# Patient Record
Sex: Male | Born: 1960 | Race: White | Hispanic: No | Marital: Single | Smoking: Former smoker
Health system: Southern US, Community
[De-identification: ages and names within clinical notes are randomized; demographics above are authoritative.]

## PROBLEM LIST (undated history)

## (undated) DIAGNOSIS — G4700 Insomnia, unspecified: Secondary | ICD-10-CM

## (undated) DIAGNOSIS — M25469 Effusion, unspecified knee: Secondary | ICD-10-CM

## (undated) DIAGNOSIS — I679 Cerebrovascular disease, unspecified: Secondary | ICD-10-CM

## (undated) DIAGNOSIS — R1013 Epigastric pain: Secondary | ICD-10-CM

## (undated) DIAGNOSIS — E785 Hyperlipidemia, unspecified: Secondary | ICD-10-CM

## (undated) DIAGNOSIS — R202 Paresthesia of skin: Secondary | ICD-10-CM

## (undated) DIAGNOSIS — R5383 Other fatigue: Secondary | ICD-10-CM

## (undated) DIAGNOSIS — H6980 Other specified disorders of Eustachian tube, unspecified ear: Secondary | ICD-10-CM

## (undated) DIAGNOSIS — L509 Urticaria, unspecified: Secondary | ICD-10-CM

## (undated) DIAGNOSIS — H6990 Unspecified Eustachian tube disorder, unspecified ear: Secondary | ICD-10-CM

## (undated) DIAGNOSIS — J322 Chronic ethmoidal sinusitis: Secondary | ICD-10-CM

## (undated) DIAGNOSIS — B079 Viral wart, unspecified: Secondary | ICD-10-CM

## (undated) DIAGNOSIS — I6529 Occlusion and stenosis of unspecified carotid artery: Secondary | ICD-10-CM

## (undated) DIAGNOSIS — G459 Transient cerebral ischemic attack, unspecified: Secondary | ICD-10-CM

## (undated) DIAGNOSIS — F419 Anxiety disorder, unspecified: Secondary | ICD-10-CM

## (undated) DIAGNOSIS — IMO0002 Reserved for concepts with insufficient information to code with codable children: Secondary | ICD-10-CM

## (undated) DIAGNOSIS — M958 Other specified acquired deformities of musculoskeletal system: Secondary | ICD-10-CM

## (undated) DIAGNOSIS — M26609 Unspecified temporomandibular joint disorder, unspecified side: Secondary | ICD-10-CM

## (undated) DIAGNOSIS — G5690 Unspecified mononeuropathy of unspecified upper limb: Secondary | ICD-10-CM

## (undated) DIAGNOSIS — M7650 Patellar tendinitis, unspecified knee: Secondary | ICD-10-CM

## (undated) DIAGNOSIS — M7040 Prepatellar bursitis, unspecified knee: Secondary | ICD-10-CM

## (undated) DIAGNOSIS — S83209A Unspecified tear of unspecified meniscus, current injury, unspecified knee, initial encounter: Secondary | ICD-10-CM

## (undated) DIAGNOSIS — L570 Actinic keratosis: Secondary | ICD-10-CM

## (undated) HISTORY — DX: Hyperlipidemia, unspecified: E78.5

## (undated) HISTORY — DX: Viral wart, unspecified: B07.9

## (undated) HISTORY — DX: Other fatigue: R53.83

## (undated) HISTORY — PX: CAROTID ENDARTERECTOMY: SUR193

## (undated) HISTORY — DX: Reserved for concepts with insufficient information to code with codable children: IMO0002

## (undated) HISTORY — DX: Urticaria, unspecified: L50.9

## (undated) HISTORY — PX: HERNIA REPAIR: SHX51

## (undated) HISTORY — DX: Paresthesia of skin: R20.2

## (undated) HISTORY — DX: Insomnia, unspecified: G47.00

## (undated) HISTORY — DX: Unspecified tear of unspecified meniscus, current injury, unspecified knee, initial encounter: S83.209A

## (undated) HISTORY — DX: Epigastric pain: R10.13

## (undated) HISTORY — PX: APPENDECTOMY: SHX54

## (undated) HISTORY — DX: Unspecified mononeuropathy of unspecified upper limb: G56.90

## (undated) HISTORY — DX: Prepatellar bursitis, unspecified knee: M70.40

## (undated) HISTORY — DX: Other specified acquired deformities of musculoskeletal system: M95.8

## (undated) HISTORY — DX: Occlusion and stenosis of unspecified carotid artery: I65.29

## (undated) HISTORY — DX: Other specified disorders of Eustachian tube, unspecified ear: H69.80

## (undated) HISTORY — DX: Effusion, unspecified knee: M25.469

## (undated) HISTORY — DX: Patellar tendinitis, unspecified knee: M76.50

## (undated) HISTORY — DX: Actinic keratosis: L57.0

## (undated) HISTORY — DX: Anxiety disorder, unspecified: F41.9

## (undated) HISTORY — DX: Chronic ethmoidal sinusitis: J32.2

## (undated) HISTORY — DX: Cerebrovascular disease, unspecified: I67.9

## (undated) HISTORY — DX: Unspecified eustachian tube disorder, unspecified ear: H69.90

## (undated) HISTORY — DX: Transient cerebral ischemic attack, unspecified: G45.9

## (undated) HISTORY — DX: Unspecified temporomandibular joint disorder, unspecified side: M26.609

---

## 1999-06-10 ENCOUNTER — Emergency Department (HOSPITAL_COMMUNITY): Admission: EM | Admit: 1999-06-10 | Discharge: 1999-06-10 | Payer: Self-pay | Admitting: Emergency Medicine

## 1999-06-10 ENCOUNTER — Encounter: Payer: Self-pay | Admitting: Emergency Medicine

## 2002-06-27 ENCOUNTER — Encounter: Admission: RE | Admit: 2002-06-27 | Discharge: 2002-06-27 | Payer: Self-pay | Admitting: Family Medicine

## 2002-06-27 ENCOUNTER — Encounter: Payer: Self-pay | Admitting: Family Medicine

## 2003-08-22 ENCOUNTER — Inpatient Hospital Stay (HOSPITAL_COMMUNITY): Admission: RE | Admit: 2003-08-22 | Discharge: 2003-08-23 | Payer: Self-pay | Admitting: General Surgery

## 2004-09-13 ENCOUNTER — Ambulatory Visit: Payer: Self-pay | Admitting: Oncology

## 2005-07-31 ENCOUNTER — Inpatient Hospital Stay (HOSPITAL_COMMUNITY): Admission: EM | Admit: 2005-07-31 | Discharge: 2005-08-02 | Payer: Self-pay | Admitting: Emergency Medicine

## 2005-07-31 DIAGNOSIS — G459 Transient cerebral ischemic attack, unspecified: Secondary | ICD-10-CM

## 2005-07-31 HISTORY — DX: Transient cerebral ischemic attack, unspecified: G45.9

## 2005-08-01 ENCOUNTER — Encounter (INDEPENDENT_AMBULATORY_CARE_PROVIDER_SITE_OTHER): Payer: Self-pay | Admitting: Cardiology

## 2009-12-21 ENCOUNTER — Encounter (INDEPENDENT_AMBULATORY_CARE_PROVIDER_SITE_OTHER): Payer: Self-pay | Admitting: *Deleted

## 2010-03-17 NOTE — Letter (Signed)
Summary: Pre Visit Letter Revised  [facility]  [address] [place], [place] [address]   Phone: [phone]  Fax: [phone]        12/21/2009 MRN: [id number]    Bob Cook [address] [place], [place]  [address]             Procedure Date:  02/16/10 at 8:30 a.m.; arrival time 7:30 a.m.   Welcome to the Gastroenterology Division at [facility].    You are scheduled to see a nurse for your pre-procedure visit on Wednesday, 01/05/10 at 9:30 a.m.  on the 3rd floor at [facility], [address]  We ask that you try to arrive at our office 15 minutes prior to your appointment time to allow for check-in.  Please take a minute to review the attached form.  If you answer "Yes" to one or more of the questions on the first page, we ask that you call the person listed at your earliest opportunity.  If you answer "No" to all of the questions, please complete the rest of the form and bring it to your appointment.    Your nurse visit will consist of discussing your medical and surgical history, your immediate family medical history, and your medications.   If you are unable to list all of your medications on the form, please bring the medication bottles to your appointment and we will list them.  We will need to be aware of both prescribed and over the counter drugs.  We will need to know exact dosage information as well.    Please be prepared to read and sign documents such as consent forms, a financial agreement, and acknowledgement forms.  If necessary, and with your consent, a friend or relative is welcome to sit-in on the nurse visit with you.  Please bring your insurance card so that we may make a copy of it.  If your insurance requires a referral to see a specialist, please bring your referral form from your primary care physician.  No co-pay is required for this nurse visit.     If you cannot keep your appointment, please call [phone] to cancel or  reschedule prior to your appointment date.  This allows [place] the opportunity to schedule an appointment for another patient in need of care.    Thank you for choosing [facility] for your medical needs.  We appreciate the opportunity to care for you.  Please visit [place] at our website  to learn more about our practice.  Sincerely, The Gastroenterology Division

## 2010-07-01 NOTE — H&P (Signed)
NAME:  Bob, Cook NO.:  [id number]   MEDICAL RECORD NO.:  [id number]           PATIENT TYPE:   LOCATION:                                 FACILITY:   PHYSICIAN:  Marlan Palau, M.D.       DATE OF BIRTH:   DATE OF ADMISSION:  07/31/2005  DATE OF DISCHARGE:                                HISTORY & PHYSICAL   HISTORY OF PRESENT ILLNESS:  Bob Cook is a 50 year old right-handed  white male born [birth year], with a history that is relatively unremarkable.  The patient comes in today with a very sudden onset of right arm numbness  that began around 3:30 p.m.  He does state it came on over seconds, has  gradually improved but not normalized over the ensuing several hours. The  patient went to the office of Dr. Herb Grays and was told to go to the  emergency room on an urgent basis.  The patient rather went home, got a  change of clothes, and came to the emergency room.  The patient was called a  code stroke.  CT scan of the brain was unremarkable.  The patient denies any  weakness of the arms or legs, visual field changes, headache.  Denied any  speech changes or swallowing problems.  Neurology is called for an  evaluation as a code stroke.   PAST MEDICAL HISTORY:  Significant for:  1.  New onset of right upper extremity numbness as above.  2.  History of incisional hernia repair of the abdomen x2.   The patient was on no medications.   Smokes cigars, drinks alcohol on occasion.   The patient has no known allergies.   SOCIAL HISTORY:  The patient is married, lives in the [place] area.  Has  1 son who is alive and well.  The patient works as a Environmental health practitioner.   FAMILY MEDICAL HISTORY:  Notable that mother died with senile dementia of  Alzheimer's type and hypertension. Father died of pancreatitis related to  alcohol overuse.  The patient has six sisters and three brothers, all alive  and well.   REVIEW OF SYSTEMS:  Notable for no recent fevers,  chills. The patient denies  headache.  Does note some neck pain over the last several days. Denies chest  pain.  Denies any shortness of breath. Does have occasional episode of heart  racing.  Denies abdominal pain, trouble controlling bowels or bladder, and  denies any blackout spells or seizures.   PHYSICAL EXAMINATION:  VITAL SIGNS: Blood pressure 138/78, heart rate 68,  respiratory rate 17. Blood pressure in the emergency room is 156/100.  GENERAL:  The patient is a minimally obese white male who is alert and  cooperative at the time of examination.  HEENT:  Head is atraumatic.  Eyes: Pupils equal, round, and reactive to  light.  Disks are flat bilaterally.  NECK: Supple.  No carotid bruits noted.  RESPIRATORY:  Examination is clear.  CARDIOVASCULAR: Examination reveals a regular rate and rhythm.  No obvious  murmurs  or rubs noted.  ABDOMEN: Positive bowel sounds.  No organomegaly or tenderness noted.  EXTREMITIES:  Without significant edema.  NEUROLOGIC: Cranial nerves as above.  Facial symmetry is present.  The  patient has full sensation of the face to pinprick and soft touch  bilaterally. He has good strength to facial muscles and muscles to head  turning and shoulder shrug bilaterally.  Speech is well enunciated, not  aphasic.  Motor testing reveals 5/5 strength in all fours.  Good symmetric  motor tone is noted throughout.  Sensory testing reveals some questionable  slight decrease in pinprick sensation of the right arm as compared to the  left, possible involvement of the right foot as compared to the left as  well.  Vibratory sensation is symmetric in the feet, decreased on the right  arm as compared to the left.  The patient has good finger-nose-finger and  heel-to-shin. Gait was not tested. No drift is seen. Deep tendon reflexes  symmetric and normal.  Toes are neutral bilaterally.  NIH Stroke Scale is 1  for sensory changes.   Laboratory values are pending at this  time.   Chest x-ray and EKG are pending.   IMPRESSION:  New onset of right arm numbness.  Rule out transient ischemic  attack or stroke.   The patient really has no significant risk factors for stroke other than  tobacco use.  The patient will be admitted and evaluated overnight to rule  out any significant large vessel disease or cardiac disease that may lead to  stroke.  The patient really has no objective deficits at this point.   PLAN:  1.  Admit to [facility].  2.  MRI scan of the brain.  3.  MR angiogram of intracranial vessels.  4.  A 2-D echocardiogram.  5.  Aspirin therapy.  6.  IV fluids.  7.  Admission blood work, urine drug screen.  8.  The patient is not given tPA due to minimal deficits.      Marlan Palau, M.D.  Electronically Signed     CKW/MEDQ  D:  07/31/2005  T:  07/31/2005  Job:  [id number]   cc:   Tammy R. Collins [place], M.D.  Fax: [phone]

## 2010-07-01 NOTE — Op Note (Signed)
NAME:  Bob Cook, Bob Cook                             ACCOUNT NO.:  [id number]   MEDICAL RECORD NO.:  [id number]                   PATIENT TYPE:  OBV   LOCATION:  0343                                 FACILITY:  [facility]   PHYSICIAN:  Gita Kudo, M.D.              DATE OF BIRTH:  06/18/1960   DATE OF PROCEDURE:  08/21/2003  DATE OF DISCHARGE:                                 OPERATIVE REPORT   OPERATIVE PROCEDURE:  Repair of abdominal incisional hernia with Bard  Composix hernia mesh, 11 x 14 cm.   SURGEON:  Gita Kudo, M.D.   ANESTHESIA:  General.   PREOPERATIVE DIAGNOSIS:  Incisional hernia.   POSTOPERATIVE DIAGNOSIS:  Incisional hernia.   CLINICAL SUMMARY:  A 50 year old male, approximately 10 years postop  exploratory laparotomy, no records available.  Presented with an abdominal  fullness in his incisional hernia on physical exam.  It has been present  several years and is enlarging.   OPERATIVE FINDINGS:  Patient had a large incisional hernia with multiple  defects that were converted into one.   OPERATION/PROCEDURE:  Under satisfactory general endotracheal anesthesia,  having received 1.0 gm Ancef preop, the patient's abdomen was prepped and  draped in a standard fashion.  The previous midline incision was opened for  its entire length and extended from below the xiphoid to above the  umbilicus.  Cautery was then used to develop the planes in the subcu between  the hernia and the subcu tissue.  Self-retaining retractors were then  placed.  Then cautery was used again to incise the hernia abdominal wall  margin, and the peritoneum entered.  The hernia sac was excised.  There was  some adherent omentum that was removed, and some was divided between ties of  2-0 Vicryl.  The abdomen was quite free of adhesions.  The under surface was  freed all around for good placement of the mesh.  Then the mesh was placed  with the smooth side down and tacked to the abdominal wall  securely with 0  Prolene sutures in each quadrant.  These passed down through the fascial  muscle and peritoneum and through the upper portion of the mesh and then  back up.  In between these, additional quadrant sutures were placed, making  a total of 8 or 9 all together.  The mesh was well adherent to the under-  surface of the abdominal wall.  The cautery was then used to do a relaxing  incision of the anterior portion of the rectus sheath to allow placement of  tissue over the mesh.  Then figure-of-eight sutures of 0 Prolene were used,  taking bites of the mesh underneath to approximate the anterior sheath to  its opposite partner.  The wound was tied.  The mesh was covered.  The  rectus muscle looked  healthy.  The wound was then lavaged with saline.  A large 19 Blake drain  was placed through an inferior stab wound, and the deep subcu approximated  with two 0 Vicryl followed by staples for skin.  There were no  complications.  Sponge and needle counts were correct.                                               Gita Kudo, M.D.    MRL/MEDQ  D:  08/21/2003  T:  08/21/2003  Job:  [id number]   cc:   Teena Irani. Arlyce Dice, M.D.  [address]  [place]  [place] [address]  Fax: [phone]

## 2010-07-01 NOTE — Discharge Summary (Signed)
NAME:  Bob, Cook NO.:  [id number]   MEDICAL RECORD NO.:  [id number]          PATIENT TYPE:  INP   LOCATION:  3005                         FACILITY:  [facility]   PHYSICIAN:  Annie Main, N.P.      DATE OF BIRTH:  1960/06/20   DATE OF ADMISSION:  07/31/2005  DATE OF DISCHARGE:  08/02/2005                                 DISCHARGE SUMMARY   DISCHARGE DIAGNOSES:  1.  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  2.  Dyslipidemia.  3.  Cigar smoker.  4.  Mild obesity.  5.  History of incisional hernia repair x2 of the abdomen.   DISCHARGE MEDICATIONS:  1.  Zocor 20 mg a day.  2.  Aggrenox 1 p.o. q.d. x 14 days and increase to b.i.d.  3.  Aspirin 81 mg a day x 14 days and then discontinue.  4.  Tylenol 2 tablets 30 minutes before Aggrenox dose x 1 week and then      discontinue.   PROCEDURES:  1.  CT on the brain on admission showed no acute abnormality.  2.  MRI of the brain showed subacute infarct left parietal lobe along the      central gyrus.  3.  MRA of the head is negative.  4.  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  No significant carotid artery disease.  5.  Chest x-ray showed no active cardiopulmonary disease.  6.  EKG showed normal sinus rhythm.  7.  2-D echocardiogram showed EF of 55-65% with no regional wall motion      abnormality, no embolic source noted.  8.  Transesophageal echocardiogram performed by Dr. Yates Decamp showed no      embolic source but was still negative.  9.  Carotid Doppler, not performed.  10. Transcranial Doppler, not performed.   LABORATORY DATA:  Laboratory studies show CBC with hemoglobin 17.7, white  blood cells 11.5, MCV 100.7, otherwise normal differential, normal sed rate.  Coagulation studies normal with antithrombin III 85.  Antiphospholipid  evaluation pending.   Protein C, protein S, and lupus anticoagulant pending.  Chemistry was normal.  Liver function tests were normal.  Hemoglobin A1c was  5.6, homocysteine 18.1.  Cholesterol was 181, triglycerides 161, HDL 26, and  LDL 123.  Urinalysis was positive for cannabinoid.  Urinalysis was normal.   HISTORY OF PRESENT ILLNESS:  [name] is a 50 year old right-handed  white male with no significant medical history who comes in the day of  admission with very sudden onset of right arm numbness that began around  3:30 p.m.  He states it came on over seconds, has gradually improved but not  normalized over the ensuing several hours.  He went to see Dr. Yehuda Budd and  was told to go the emergency room on an urgent basis.  The patient, rather,  went home, got a change of clothes  and came to the emergency room.  The  patient was code stroke.  CT of the brain was unremarkable.  The patient  denies any weakness of the arms, no visual field changes or headache.  Neurology was asked to evaluate, and he was admitted for further stroke  workup.  He was not a tPA candidate, secondary to time.   HOSPITAL COURSE:  MRI was positive for what look like an embolic left  parietal MCA branch infarct.  Emboli workup was performed and no source  found.  The patient was in normal sinus rhythm throughout 48 hours of  hospitalization.  2-D echocardiogram and TEE were both unrevealing of  cardioembolic source.  The patient was identified to have risk factors of  dyslipidemia with LDL 123.  Goal LDL is less than 100 in post-stroke  patients.  The patient also with mild obesity and recommended weight loss at  time of discharge.  The patient has a history of smoking, had quit 2 years  ago but still smokes and inhale cigars.  He was advised to quit.  The  patient was placed on Aggrenox for secondary stroke prevention, as well as  statin for cholesterol control.  He will need followup with Dr. Pearlean Brownie, in  his office, for  transcranial Doppler bubble study and emboli monitoring.  He  has no focal neurologic deficits.  No need for therapy followup and will be  discharged home.   CONDITION ON DISCHARGE:  The patient is awake, alert, oriented x3.  Cranial  nerves normal.  No focal neurologic deficit.  No sensory loss.  His heart  rate is regular.  His lungs are clear.  His gait is steady.   PLAN:  1.  Discharge home in care of wife.  2.  Aggrenox for secondary stroke prevention.  3.  Statin for LDL greater than 100.  4.  Followup with Dr. Marlis Edelson office to do transcranial Doppler with bubble      study and emboli monitoring.  5.  Follow up with Dr. Pearlean Brownie in 2 months.  6.  Follow up with Dr. Yehuda Budd in 1 month.      Annie Main, N.P.     SB/MEDQ  D:  08/02/2005  T:  08/02/2005  Job:  [id number]   cc:   Tammy R. Collins [place], M.D.  Fax: [phone]   Pramod P. Pearlean Brownie, MD  Fax: [phone]

## 2013-06-09 ENCOUNTER — Other Ambulatory Visit: Payer: Self-pay | Admitting: *Deleted

## 2013-06-09 DIAGNOSIS — L98499 Non-pressure chronic ulcer of skin of other sites with unspecified severity: Principal | ICD-10-CM

## 2013-06-09 DIAGNOSIS — I739 Peripheral vascular disease, unspecified: Secondary | ICD-10-CM

## 2013-06-09 DIAGNOSIS — I70219 Atherosclerosis of native arteries of extremities with intermittent claudication, unspecified extremity: Secondary | ICD-10-CM

## 2013-06-12 ENCOUNTER — Encounter: Payer: Self-pay | Admitting: Vascular Surgery

## 2013-07-02 ENCOUNTER — Encounter: Payer: Self-pay | Admitting: Vascular Surgery

## 2013-07-02 ENCOUNTER — Encounter (HOSPITAL_COMMUNITY): Payer: Self-pay

## 2013-07-02 ENCOUNTER — Other Ambulatory Visit (HOSPITAL_COMMUNITY): Payer: Self-pay

## 2013-07-08 ENCOUNTER — Encounter: Payer: Self-pay | Admitting: Vascular Surgery

## 2013-07-09 ENCOUNTER — Encounter (HOSPITAL_COMMUNITY): Payer: Self-pay

## 2013-07-09 ENCOUNTER — Encounter: Payer: Self-pay | Admitting: Vascular Surgery

## 2013-07-09 ENCOUNTER — Other Ambulatory Visit (HOSPITAL_COMMUNITY): Payer: Self-pay

## 2014-02-27 ENCOUNTER — Ambulatory Visit (INDEPENDENT_AMBULATORY_CARE_PROVIDER_SITE_OTHER): Payer: Managed Care, Other (non HMO)

## 2014-02-27 ENCOUNTER — Ambulatory Visit (INDEPENDENT_AMBULATORY_CARE_PROVIDER_SITE_OTHER): Payer: Managed Care, Other (non HMO) | Admitting: Podiatry

## 2014-02-27 ENCOUNTER — Encounter: Payer: Self-pay | Admitting: Podiatry

## 2014-02-27 VITALS — BP 144/81 | HR 61 | Resp 13

## 2014-02-27 DIAGNOSIS — M79671 Pain in right foot: Secondary | ICD-10-CM

## 2014-02-27 DIAGNOSIS — M722 Plantar fascial fibromatosis: Secondary | ICD-10-CM

## 2014-02-27 DIAGNOSIS — M7661 Achilles tendinitis, right leg: Secondary | ICD-10-CM

## 2014-02-27 MED ORDER — TRIAMCINOLONE ACETONIDE 10 MG/ML IJ SUSP
10.0000 mg | Freq: Once | INTRAMUSCULAR | Status: AC
  Administered 2014-02-27: 10 mg

## 2014-02-27 NOTE — Progress Notes (Signed)
   Subjective:    Patient ID: Bob Cook, Bob Cook    DOB: 03/26/1960, 54 y.o.   MRN: [id number]  HPI 54 year old Bob Cook presents the office today with complaints of right heel pain which has been ongoing since October. He denies any history of injury or trauma the time of the onset of symptoms and he denies any change or increases activity. A Christmas he got a brace for plantar fasciitis which she states seemed to make the symptoms worse. He has pain in the morning, after periods of rest or after periods of activity. He states it feels like a pulling sensation in his foot.   Previously the patient had vascular studies ordered by his primary care physician however he was unable to get the study done. He does that he is able to walk approximately 8 blocks at a time before having to stop due to discomfort and cramping in his legs. No other complaints at this time.   Review of Systems  HENT: Positive for tinnitus.   Cardiovascular:       Calf pain with walking.  Musculoskeletal: Positive for back pain, arthralgias and gait problem.  All other systems reviewed and are negative.      Objective:   Physical Exam AAO x3, NAD DP/PT pulses palpable bilaterally, CRT less than 3 seconds Protective sensation intact with Simms Weinstein monofilament, vibratory sensation intact, Achilles tendon reflex intact Tenderness to palpation overlying the plantar medial tubercle of the calcaneus to right heel at the insertion of the plantar fascia. There is no pain along the course of plantar fascial within the arch of the foot. There is no pain with lateral compression of the calcaneus or pain the vibratory sensation. There is mild pain on the posterior aspect of the calcaneus at the insertion of the achilles tendon. There is no pain along the mid substance of the tendon, and the achilles tendon is intact. There is no overlying edema, erythema, increase in warmth to bilateral lower extremities.. No other areas of  tenderness palpation or pain with vibratory sensation to the foot/ankle bilaterally. MMT 5/5, ROM WNL No open lesions or pre-ulcerative lesions are identified. No pain with calf compression, swelling, warmth, erythema.     Assessment & Plan:  54 year old Bob Cook with right foot plantar fasciitis/insertional Achilles tendinitis. -X-rays were obtained and reviewed with the patient. -Treatment options were discussed including alternatives, risks, competitions. -Patient elects to proceed with steroid injection into the right plantar heel. Under sterile skin preparation, a total of 2.5cc of kenalog 10, 0.5% Marcaine plain, and 2% lidocaine plain were infiltrated into the symptomatic area without complication. A band-aid was applied. Patient tolerated the injection well without complication. Post-injection care with discussed with the patient. Discussed with the patient to ice the area over the next couple of days to help prevent a steroid flare.  -Applied of plantar fascial taping.  -Discussed stretching exercises.  -Ice to the area.  -Discussed shoe gear modifications and orthotics.  -Discussed with the patient to have vascular studies performed due to cramping his legs. He states he has an appointment with his PCP in a week, and would like to discuss with them at that time.  -Follow-up in 3 weeks or sooner should any problems arise. In the meantime, occurs call the office in the questions, concerns, change in symptoms. Follow-up with PCP for other issues mentioned in the ROS.

## 2014-02-27 NOTE — Patient Instructions (Signed)
Plantar Fasciitis (Heel Spur Syndrome) with Rehab The plantar fascia is a fibrous, ligament-like, soft-tissue structure that spans the bottom of the foot. Plantar fasciitis is a condition that causes pain in the foot due to inflammation of the tissue. SYMPTOMS   Pain and tenderness on the underneath side of the foot.  Pain that worsens with standing or walking. CAUSES  Plantar fasciitis is caused by irritation and injury to the plantar fascia on the underneath side of the foot. Common mechanisms of injury include:  Direct trauma to bottom of the foot.  Damage to a small nerve that runs under the foot where the main fascia attaches to the heel bone.  Stress placed on the plantar fascia due to bone spurs. RISK INCREASES WITH:   Activities that place stress on the plantar fascia (running, jumping, pivoting, or cutting).  Poor strength and flexibility.  Improperly fitted shoes.  Tight calf muscles.  Flat feet.  Failure to warm-up properly before activity.  Obesity. PREVENTION  Warm up and stretch properly before activity.  Allow for adequate recovery between workouts.  Maintain physical fitness:  Strength, flexibility, and endurance.  Cardiovascular fitness.  Maintain a health body weight.  Avoid stress on the plantar fascia.  Wear properly fitted shoes, including arch supports for individuals who have flat feet. PROGNOSIS  If treated properly, then the symptoms of plantar fasciitis usually resolve without surgery. However, occasionally surgery is necessary. RELATED COMPLICATIONS   Recurrent symptoms that may result in a chronic condition.  Problems of the lower back that are caused by compensating for the injury, such as limping.  Pain or weakness of the foot during push-off following surgery.  Chronic inflammation, scarring, and partial or complete fascia tear, occurring more often from repeated injections. TREATMENT  Treatment initially involves the use of  ice and medication to help reduce pain and inflammation. The use of strengthening and stretching exercises may help reduce pain with activity, especially stretches of the Achilles tendon. These exercises may be performed at home or with a therapist. Your caregiver may recommend that you use heel cups of arch supports to help reduce stress on the plantar fascia. Occasionally, corticosteroid injections are given to reduce inflammation. If symptoms persist for greater than 6 months despite non-surgical (conservative), then surgery may be recommended.  MEDICATION   If pain medication is necessary, then nonsteroidal anti-inflammatory medications, such as aspirin and ibuprofen, or other minor pain relievers, such as acetaminophen, are often recommended.  Do not take pain medication within 7 days before surgery.  Prescription pain relievers may be given if deemed necessary by your caregiver. Use only as directed and only as much as you need.  Corticosteroid injections may be given by your caregiver. These injections should be reserved for the most serious cases, because they may only be given a certain number of times. HEAT AND COLD  Cold treatment (icing) relieves pain and reduces inflammation. Cold treatment should be applied for 10 to 15 minutes every 2 to 3 hours for inflammation and pain and immediately after any activity that aggravates your symptoms. Use ice packs or massage the area with a piece of ice (ice massage).  Heat treatment may be used prior to performing the stretching and strengthening activities prescribed by your caregiver, physical therapist, or athletic trainer. Use a heat pack or soak the injury in warm water. SEEK IMMEDIATE MEDICAL CARE IF:  Treatment seems to offer no benefit, or the condition worsens.  Any medications produce adverse side effects. EXERCISES RANGE   OF MOTION (ROM) AND STRETCHING EXERCISES - Plantar Fasciitis (Heel Spur Syndrome) These exercises may help you  when beginning to rehabilitate your injury. Your symptoms may resolve with or without further involvement from your physician, physical therapist or athletic trainer. While completing these exercises, remember:   Restoring tissue flexibility helps normal motion to return to the joints. This allows healthier, less painful movement and activity.  An effective stretch should be held for at least 30 seconds.  A stretch should never be painful. You should only feel a gentle lengthening or release in the stretched tissue. RANGE OF MOTION - Toe Extension, Flexion  Sit with your right / left leg crossed over your opposite knee.  Grasp your toes and gently pull them back toward the top of your foot. You should feel a stretch on the bottom of your toes and/or foot.  Hold this stretch for __________ seconds.  Now, gently pull your toes toward the bottom of your foot. You should feel a stretch on the top of your toes and or foot.  Hold this stretch for __________ seconds. Repeat __________ times. Complete this stretch __________ times per day.  RANGE OF MOTION - Ankle Dorsiflexion, Active Assisted  Remove shoes and sit on a chair that is preferably not on a carpeted surface.  Place right / left foot under knee. Extend your opposite leg for support.  Keeping your heel down, slide your right / left foot back toward the chair until you feel a stretch at your ankle or calf. If you do not feel a stretch, slide your bottom forward to the edge of the chair, while still keeping your heel down.  Hold this stretch for __________ seconds. Repeat __________ times. Complete this stretch __________ times per day.  STRETCH - Gastroc, Standing  Place hands on wall.  Extend right / left leg, keeping the front knee somewhat bent.  Slightly point your toes inward on your back foot.  Keeping your right / left heel on the floor and your knee straight, shift your weight toward the wall, not allowing your back to  arch.  You should feel a gentle stretch in the right / left calf. Hold this position for __________ seconds. Repeat __________ times. Complete this stretch __________ times per day. STRETCH - Soleus, Standing  Place hands on wall.  Extend right / left leg, keeping the other knee somewhat bent.  Slightly point your toes inward on your back foot.  Keep your right / left heel on the floor, bend your back knee, and slightly shift your weight over the back leg so that you feel a gentle stretch deep in your back calf.  Hold this position for __________ seconds. Repeat __________ times. Complete this stretch __________ times per day. STRETCH - Gastrocsoleus, Standing  Note: This exercise can place a lot of stress on your foot and ankle. Please complete this exercise only if specifically instructed by your caregiver.   Place the ball of your right / left foot on a step, keeping your other foot firmly on the same step.  Hold on to the wall or a rail for balance.  Slowly lift your other foot, allowing your body weight to press your heel down over the edge of the step.  You should feel a stretch in your right / left calf.  Hold this position for __________ seconds.  Repeat this exercise with a slight bend in your right / left knee. Repeat __________ times. Complete this stretch __________ times per day.    STRENGTHENING EXERCISES - Plantar Fasciitis (Heel Spur Syndrome)  These exercises may help you when beginning to rehabilitate your injury. They may resolve your symptoms with or without further involvement from your physician, physical therapist or athletic trainer. While completing these exercises, remember:   Muscles can gain both the endurance and the strength needed for everyday activities through controlled exercises.  Complete these exercises as instructed by your physician, physical therapist or athletic trainer. Progress the resistance and repetitions only as guided. STRENGTH -  Towel Curls  Sit in a chair positioned on a non-carpeted surface.  Place your foot on a towel, keeping your heel on the floor.  Pull the towel toward your heel by only curling your toes. Keep your heel on the floor.  If instructed by your physician, physical therapist or athletic trainer, add ____________________ at the end of the towel. Repeat __________ times. Complete this exercise __________ times per day. STRENGTH - Ankle Inversion  Secure one end of a rubber exercise band/tubing to a fixed object (table, pole). Loop the other end around your foot just before your toes.  Place your fists between your knees. This will focus your strengthening at your ankle.  Slowly, pull your big toe up and in, making sure the band/tubing is positioned to resist the entire motion.  Hold this position for __________ seconds.  Have your muscles resist the band/tubing as it slowly pulls your foot back to the starting position. Repeat __________ times. Complete this exercises __________ times per day.  Document Released: 01/30/2005 Document Revised: 04/24/2011 Document Reviewed: 05/14/2008 [employer] Patient Information 2015 [employer]. This information is not intended to replace advice given to you by your health care provider. Make sure you discuss any questions you have with your health care provider.  

## 2014-03-01 ENCOUNTER — Encounter: Payer: Self-pay | Admitting: Podiatry

## 2014-03-25 ENCOUNTER — Ambulatory Visit: Payer: Managed Care, Other (non HMO) | Admitting: Podiatry

## 2014-05-29 ENCOUNTER — Encounter: Payer: Self-pay | Admitting: Neurology

## 2014-05-29 ENCOUNTER — Ambulatory Visit (INDEPENDENT_AMBULATORY_CARE_PROVIDER_SITE_OTHER): Payer: Managed Care, Other (non HMO) | Admitting: Neurology

## 2014-05-29 ENCOUNTER — Telehealth: Payer: Self-pay | Admitting: Neurology

## 2014-05-29 ENCOUNTER — Other Ambulatory Visit (HOSPITAL_COMMUNITY): Payer: Self-pay | Admitting: Neurology

## 2014-05-29 ENCOUNTER — Telehealth: Payer: Self-pay | Admitting: *Deleted

## 2014-05-29 ENCOUNTER — Other Ambulatory Visit: Payer: Self-pay | Admitting: *Deleted

## 2014-05-29 VITALS — BP 126/80 | HR 59 | Ht 67.0 in | Wt 211.4 lb

## 2014-05-29 DIAGNOSIS — R413 Other amnesia: Secondary | ICD-10-CM

## 2014-05-29 DIAGNOSIS — Z8673 Personal history of transient ischemic attack (TIA), and cerebral infarction without residual deficits: Secondary | ICD-10-CM

## 2014-05-29 DIAGNOSIS — I739 Peripheral vascular disease, unspecified: Secondary | ICD-10-CM

## 2014-05-29 DIAGNOSIS — I779 Disorder of arteries and arterioles, unspecified: Secondary | ICD-10-CM

## 2014-05-29 DIAGNOSIS — R519 Headache, unspecified: Secondary | ICD-10-CM

## 2014-05-29 DIAGNOSIS — F329 Major depressive disorder, single episode, unspecified: Secondary | ICD-10-CM | POA: Diagnosis not present

## 2014-05-29 DIAGNOSIS — R51 Headache: Principal | ICD-10-CM

## 2014-05-29 DIAGNOSIS — F32A Depression, unspecified: Secondary | ICD-10-CM

## 2014-05-29 MED ORDER — CITALOPRAM HYDROBROMIDE 10 MG PO TABS: 10.0000 mg | ORAL_TABLET | Freq: Every day | ORAL | Status: DC

## 2014-05-29 NOTE — Telephone Encounter (Signed)
[facility] with patient Bob Cook [facility]

## 2014-05-29 NOTE — Telephone Encounter (Signed)
Pt called and left message on the voice mail that he sch his MRI and wanted to talk to the nurse please call [phone] or [phone]

## 2014-05-29 NOTE — Patient Instructions (Addendum)
1. Schedule MRI brain without contrast- call [facility] to schedule [phone] 2. Schedule carotid ultrasound 3. Bloodwork from your PCP will be requested for review 4. Start Celexa 10mg : Take 1 tablet daily 5. Physical exercise and brain stimulation exercises are important for brain health 6. Follow-up in 3 months, call our office for any changes

## 2014-05-29 NOTE — Progress Notes (Signed)
NEUROLOGY CONSULTATION NOTE  Bob HENEGAR MRN: [id number] DOB: 1960/07/16  Referring provider: Dr. Herb Grays Primary care provider: Dr. Herb Grays  Reason for consult:  Headaches, memory loss, inability to focus  Dear Dr Collins Scotland:  Thank you for your kind referral of Bob Cook for consultation of the above symptoms. Although his history is well known to you, please allow me to reiterate it for the purpose of our medical record. Records and images were personally reviewed where available.  HISTORY OF PRESENT ILLNESS: This is a pleasant 54 year old right-handed man with a history of hyperlipidemia, left parietal CVA in 2007, right carotid artery stenosis s/p CEA in 2007, presenting for headaches and memory loss. He reports a pressure over the vertex lasting a few minutes for the past several years. He reports these are infrequent, with no associated nausea, vomiting, photo or phonophobia. More concerning for him is the memory. He has noticed that in the past couple of years, he has been trying to take a test at work but has had to read the question several times. He still gets the answer wrong. Even if he saw the correct answer, he cannot remember it. He denies getting lost driving, but his wife had told him 3 times today that his appointment today was across the hospital, instead he went to a different location. He has word-finding difficulties, saying words he did not mean to say. He reports this occurring even when he was younger. He has occasional difficulties multitasking. He has had anxiety and found the marijuana helps him better than anxiety. He finds himself getting angry easily, or sits at home and tears up easily.   In 2007, he was having vision changes where half of his vision felt like it was submerged in the inferior quadrant. This happened several times, then he had right hand numbness occurring 10-15 minutes at a time. He was found to have a left parietal stroke. 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 He tells me the right CEA was also done in 2007.    He denies any dizziness, diplopia, dysarthria, dysphagia. He has chronic neck and back pain. He has chronic paresthesias in his hand, and reports his right index finger gets cold. He has numbness in both feet, right more than left, when standing for prolonged periods, no pain. He usually gets 5-6 hours of sleep, his wife reports snoring, no apnea. His mother has a history of Alzheimer's disease. He denies any head injuries, rare alcohol intake.   Laboratory Data: 04/17/14: CBC, CMP normal.  PAST MEDICAL HISTORY: Past Medical History  Diagnosis Date  . TIA (transient ischemic attack) 07/31/2005  . Keratosis   . Hyperlipidemia   . ETD (eustachian tube dysfunction)   . Carotid artery occlusion   . Insomnia   . Patellar tendinitis   . Mononeuritis arm   . Meniscus tear   . Effusion of lower leg joint   . Paresthesia of skin   . Cerebrovascular disease   . Ethmoidal sinusitis   . Fatigue   . TMJ (temporomandibular joint disorder)   . Deformity of clavicle   . Viral warts   . Prepatellar bursitis   . Dyspepsia   . DDD (degenerative disc disease)   . Anxiety     PAST SURGICAL HISTORY: Past Surgical History  Procedure Laterality Date  . Hernia repair    .  Appendectomy    . Carotid endarterectomy      MEDICATIONS: Current Outpatient Prescriptions on File Prior to Visit  Medication Sig Dispense Refill  . acyclovir (ZOVIRAX) 400 MG tablet Take 400 mg by mouth daily.    Marland Kitchen. atorvastatin (LIPITOR) 80 MG tablet Take 80 mg by mouth daily.    . clopidogrel (PLAVIX) 75 MG tablet Take 75 mg by mouth daily with breakfast.    . ezetimibe (ZETIA) 10 MG tablet Take 10 mg by mouth daily.    Marland Kitchen. ibuprofen (ADVIL,MOTRIN) 800 MG tablet Take 800 mg by mouth every 8 (eight) hours as needed.    Marland Kitchen. omeprazole (PRILOSEC)  20 MG capsule Take 20 mg by mouth daily.     No current facility-administered medications on file prior to visit.    ALLERGIES: No Known Allergies  FAMILY HISTORY: History reviewed. No pertinent family history.  SOCIAL HISTORY: History   Social History  . Marital Status: Single    Spouse Name: N/A  . Number of Children: N/A  . Years of Education: N/A   Occupational History  . Not on file.   Social History Main Topics  . Smoking status: Former Smoker -- 1.00 packs/day for 30 years    Types: Cigarettes  . Smokeless tobacco: Not on file  . Alcohol Use: Yes  . Drug Use: No  . Sexual Activity: Not on file   Other Topics Concern  . Not on file   Social History Narrative    REVIEW OF SYSTEMS: Constitutional: No fevers, chills, or sweats, no generalized fatigue, change in appetite Eyes: No visual changes, double vision, eye pain Ear, nose and throat: No hearing loss, ear pain, nasal congestion, sore throat Cardiovascular: No chest pain, palpitations Respiratory:  No shortness of breath at rest or with exertion, wheezes GastrointestinaI: No nausea, vomiting, diarrhea, abdominal pain, fecal incontinence Genitourinary:  No dysuria, urinary retention or frequency Musculoskeletal:  + neck pain, back pain Integumentary: No rash, pruritus, skin lesions Neurological: as above Psychiatric: + depression, insomnia, anxiety Endocrine: No palpitations, fatigue, diaphoresis, mood swings, change in appetite, change in weight, increased thirst Hematologic/Lymphatic:  No anemia, purpura, petechiae. Allergic/Immunologic: no itchy/runny eyes, nasal congestion, recent allergic reactions, rashes  PHYSICAL EXAM: Filed Vitals:   05/29/14 1001  BP: 126/80  Pulse: 59   General: No acute distress Head:  Normocephalic/atraumatic Eyes: Fundoscopic exam shows bilateral sharp discs, no vessel changes, exudates, or hemorrhages Neck: supple, no paraspinal tenderness, full range of motion Back:  No paraspinal tenderness Heart: regular rate and rhythm Lungs: Clear to auscultation bilaterally. Vascular: No carotid bruits. Skin/Extremities: No rash, no edema Neurological Exam: Mental status: alert and oriented to person, place, and time, no dysarthria or aphasia, Fund of knowledge is appropriate.  Recent and remote memory are intact.  Attention and concentration are normal.    Able to name objects and repeat phrases.  Montreal Cognitive Assessment  05/29/2014  Visuospatial/ Executive (0/5) 5  Naming (0/3) 3  Attention: Read list of digits (0/2) 2  Attention: Read list of letters (0/1) 1  Attention: Serial 7 subtraction starting at 100 (0/3) 3  Language: Repeat phrase (0/2) 2  Language : Fluency (0/1) 1  Abstraction (0/2) 2  Delayed Recall (0/5) 1  Orientation (0/6) 6  Total 26  Adjusted Score (based on education) 26   Cranial nerves: CN I: not tested CN II: pupils equal, round and reactive to light, visual fields intact, fundi unremarkable. CN III, IV, VI:  full range of motion, no  nystagmus, no ptosis CN V: decreased pin and cold on right V2-3 (chronic since stroke) CN VII: upper and lower face symmetric CN VIII: hearing intact to finger rub CN IX, X: gag intact, uvula midline CN XI: sternocleidomastoid and trapezius muscles intact CN XII: tongue midline Bulk & Tone: normal, no fasciculations. Motor: 5/5 throughout with no pronator drift. Sensation: intact to light touch, cold, pin, vibration and joint position sense.  No extinction to double simultaneous stimulation.  Romberg test negative Deep Tendon Reflexes: +2 throughout, no ankle clonus Plantar responses: downgoing bilaterally Cerebellar: no incoordination on finger to nose, heel to shin. No dysdiadochokinesia Gait: narrow-based and steady, able to tandem walk adequately. Tremor: none  IMPRESSION: This is a 54 year old right-handed man with a history of hyperlipidemia, left parietal CVA in 2007, right CEA,  presenting for worsening memory loss and headaches. He reports the headaches are infrequent, possibly tension-type headaches. MOCA score today is 26/30. We discussed different causes of memory loss. Check TSH and B12. MRI brain without contrast will be ordered to assess for underlying structural abnormality and assess vascular load. 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 Start Celexa  daily, side effects were discussed. He has not had interval imaging after right CEA, carotid dopplers will be ordered, continue lipid control. We discussed the importance of control of vascular risk factors, physical exercise, and brain stimulation exercises for brain health. He will follow-up in 3 months.\  Thank you for allowing me to participate in the care of this patient. Please do not hesitate to call for any questions or concerns.   Patrcia Dolly, M.D.  CC: Dr. Collins Scotland

## 2014-05-29 NOTE — Telephone Encounter (Signed)
-----   Message from Van ClinesKaren M Aquino, MD sent at 05/29/2014  4:03 PM EDT ----- Regarding: labs Pls let him know I reviewed PCP labs, TSH and B12 were not done. Would go ahead and order for him, pls send labslip. Thanks

## 2014-05-29 NOTE — Telephone Encounter (Signed)
Orders placed and lab slips at from desk patient notified

## 2014-06-03 ENCOUNTER — Ambulatory Visit
Admission: RE | Admit: 2014-06-03 | Discharge: 2014-06-03 | Disposition: A | Discharge status: Home / Self Care | Payer: Managed Care, Other (non HMO) | Source: Ambulatory Visit | Attending: Neurology | Admitting: Neurology

## 2014-06-03 ENCOUNTER — Telehealth: Payer: Self-pay | Admitting: Family Medicine

## 2014-06-03 ENCOUNTER — Ambulatory Visit (HOSPITAL_COMMUNITY)
Admission: RE | Admit: 2014-06-03 | Discharge: 2014-06-03 | Disposition: A | Discharge status: Home / Self Care | Payer: Managed Care, Other (non HMO) | Source: Ambulatory Visit | Attending: Neurology | Admitting: Neurology

## 2014-06-03 DIAGNOSIS — I6523 Occlusion and stenosis of bilateral carotid arteries: Secondary | ICD-10-CM | POA: Diagnosis not present

## 2014-06-03 DIAGNOSIS — Z8673 Personal history of transient ischemic attack (TIA), and cerebral infarction without residual deficits: Secondary | ICD-10-CM

## 2014-06-03 DIAGNOSIS — R413 Other amnesia: Secondary | ICD-10-CM

## 2014-06-03 DIAGNOSIS — I739 Peripheral vascular disease, unspecified: Secondary | ICD-10-CM

## 2014-06-03 DIAGNOSIS — I779 Disorder of arteries and arterioles, unspecified: Secondary | ICD-10-CM

## 2014-06-03 DIAGNOSIS — F329 Major depressive disorder, single episode, unspecified: Secondary | ICD-10-CM

## 2014-06-03 DIAGNOSIS — R51 Headache: Secondary | ICD-10-CM | POA: Insufficient documentation

## 2014-06-03 DIAGNOSIS — F32A Depression, unspecified: Secondary | ICD-10-CM

## 2014-06-03 DIAGNOSIS — R519 Headache, unspecified: Secondary | ICD-10-CM

## 2014-06-03 NOTE — Progress Notes (Signed)
Bilateral carotid artery duplex completed:  1-39% ICA stenosis.  Vertebral artery flow is antegrade.     

## 2014-06-03 NOTE — Telephone Encounter (Signed)
Lmovm to return my call. 

## 2014-06-03 NOTE — Telephone Encounter (Signed)
Patient returned my call. I notified him of result. 

## 2014-06-03 NOTE — Telephone Encounter (Signed)
-----   Message from Van ClinesKaren M Aquino, MD sent at 06/03/2014  3:36 PM EDT ----- Pls let him know no significant obstruction in carotid arteries, thanks

## 2014-06-04 ENCOUNTER — Telehealth: Payer: Self-pay | Admitting: Family Medicine

## 2014-06-04 LAB — TSH: TSH: 1.935 u[IU]/mL (ref 0.350–4.500)

## 2014-06-04 LAB — VITAMIN B12: VITAMIN B 12: 430 pg/mL (ref 211–911)

## 2014-06-04 NOTE — Telephone Encounter (Signed)
-----   Message from Van ClinesKaren M Aquino, MD sent at 06/04/2014 11:40 AM EDT ----- Pls let him know I reviewed MRI brain, looks good, no new stroke. No tumor, bleed. Also, his B12 and thyroid levels are normal. Thanks

## 2014-06-04 NOTE — Telephone Encounter (Signed)
Patient was notified of results.  

## 2014-07-30 ENCOUNTER — Telehealth: Payer: Self-pay | Admitting: Neurology

## 2014-07-30 NOTE — Telephone Encounter (Signed)
Pt's wife Bob Cook called in regards to her husbands medication CeleXA/Dawn CB#[phone]

## 2014-07-30 NOTE — Telephone Encounter (Signed)
I spoke with patient's wife. She states for the past 2 weeks that patient has been moody & very emotional. She states he called today in tears about his job and worrying about seeing his kids as they don't live in the area. She wanted to know if you think the Celexa need to be increased. He is on Celexa 10 mg daily.

## 2014-07-31 NOTE — Telephone Encounter (Signed)
Lmovm to return my call. 

## 2014-07-31 NOTE — Telephone Encounter (Signed)
Increase Celexa to 20mg  daily, but if symptoms continue, recommend he see a psychiatrist for the depression. Thanks

## 2014-08-03 MED ORDER — CITALOPRAM HYDROBROMIDE 20 MG PO TABS: 20.0000 mg | ORAL_TABLET | Freq: Every day | ORAL | Status: DC

## 2014-08-03 NOTE — Telephone Encounter (Signed)
Called patient again & I was able to speak with him. I did notify him of Dr. Rosalyn Gess advisement. Will send in new Rx for Celexa 20 mg to his pharmacy.

## 2014-08-03 NOTE — Addendum Note (Signed)
Addended by: Franciso Bend on: 08/03/2014 09:39 AM   Modules accepted: Orders

## 2014-08-28 ENCOUNTER — Ambulatory Visit: Payer: Managed Care, Other (non HMO) | Admitting: Neurology

## 2016-01-20 IMAGING — MR MR HEAD W/O CM
8 of 10 series · 39 of 48 positions shown · non-contrast
Comparison: MRI brain 07/31/2005

CLINICAL DATA: Headaches for 4 months. Memory problems. Numbness in
both hands and feet at times over the last 3 years.

EXAM:
MRI HEAD WITHOUT CONTRAST
TECHNIQUE: Multiplanar, multiecho pulse sequences of the brain and surrounding
structures were obtained without intravenous contrast.

[Series 3: FLAIR · sagittal · 5.0mm · 0.47mm/px · 2 of 25 slices shown (1 of 2)]
[im 1/25]
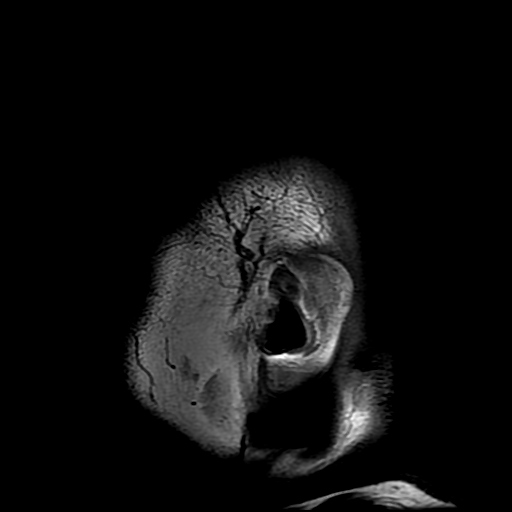
[im 25/25]
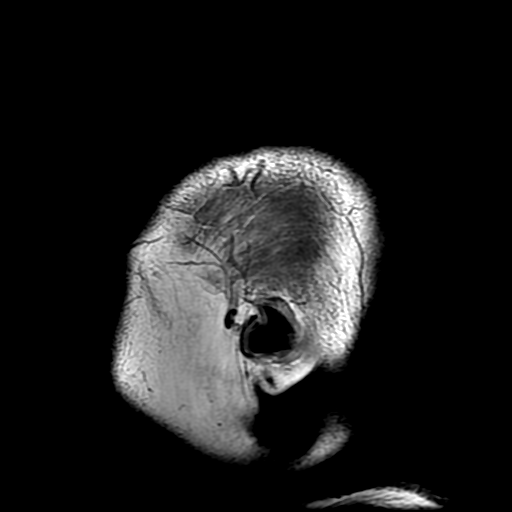

[Series 4: DWI · axial · 3.0mm · 1.09mm/px · z∈[-35,+126]mm · 11 of 110 slices shown (1 of 4)]
[im 1/110]
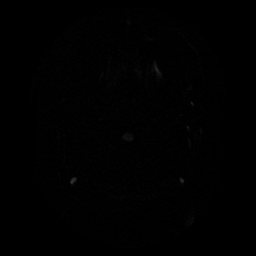
[im 11/110]
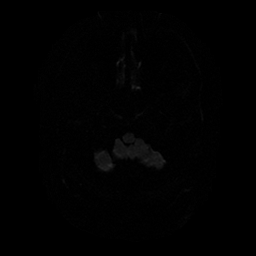
[im 22/110]
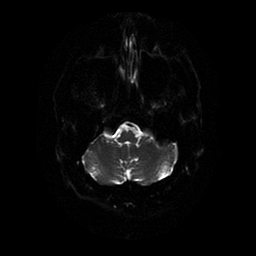
[im 33/110]
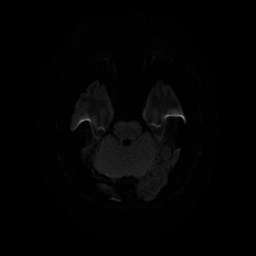
[im 44/110]
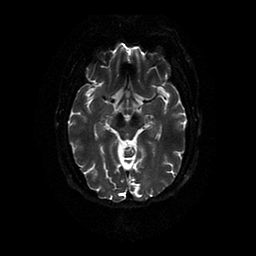
[im 55/110]
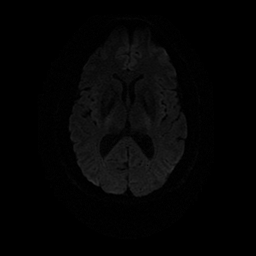
[im 66/110]
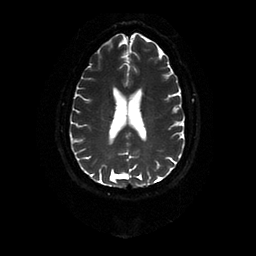
[im 77/110]
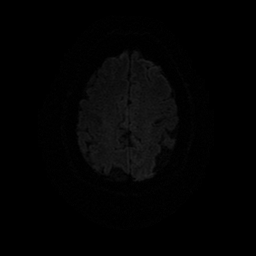
[im 88/110]
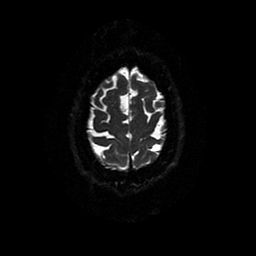
[im 99/110]
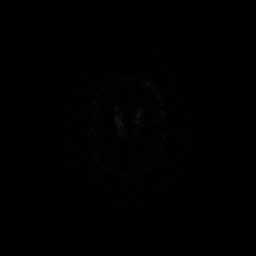
[im 110/110]
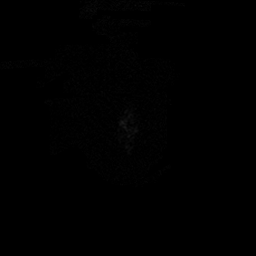

[Series 5: DWI · coronal · 5.0mm · 1.09mm/px · 7 of 72 slices shown (2 of 4)]
[im 1/72]
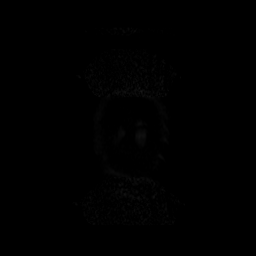
[im 12/72]
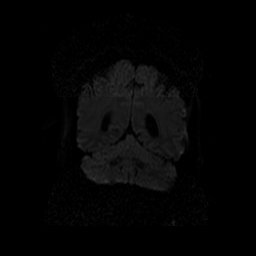
[im 24/72]
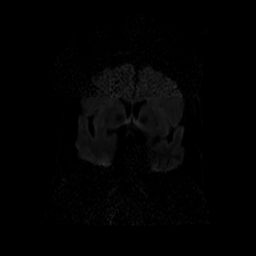
[im 36/72]
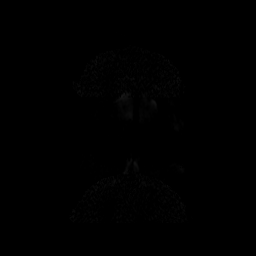
[im 48/72]
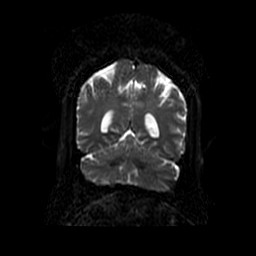
[im 60/72]
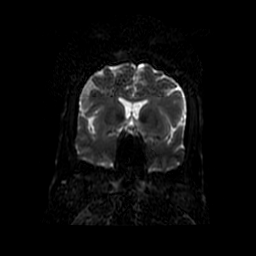
[im 72/72]
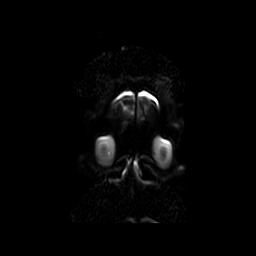

[Series 6: (id) · axial · 5.0mm · 0.43mm/px · z∈[-46,+116]mm · 3 of 28 slices shown]
[im 1/28]
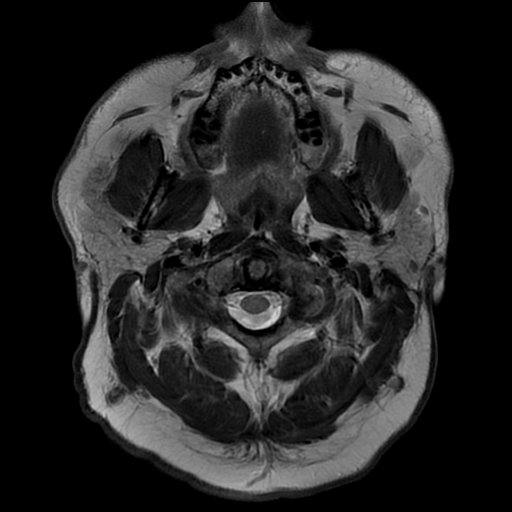
[im 14/28]
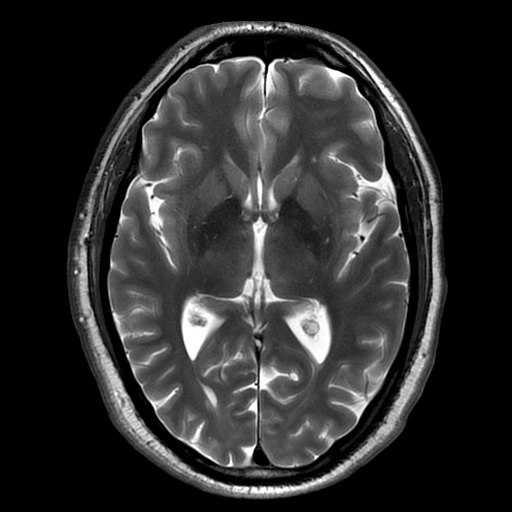
[im 28/28]
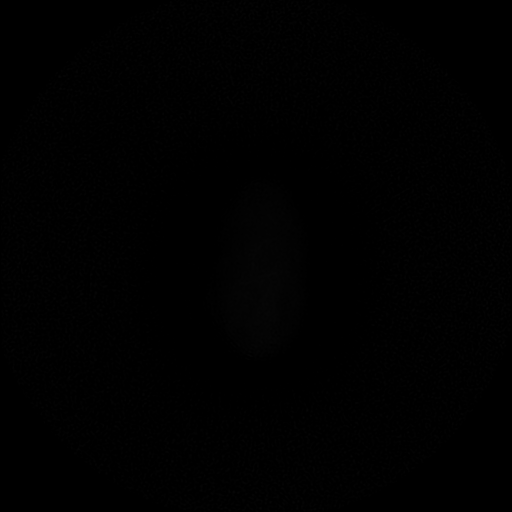

[Series 8: FLAIR · axial · 5.0mm · 0.43mm/px · z∈[-46,+116]mm · 3 of 28 slices shown (2 of 2)]
[im 1/28]
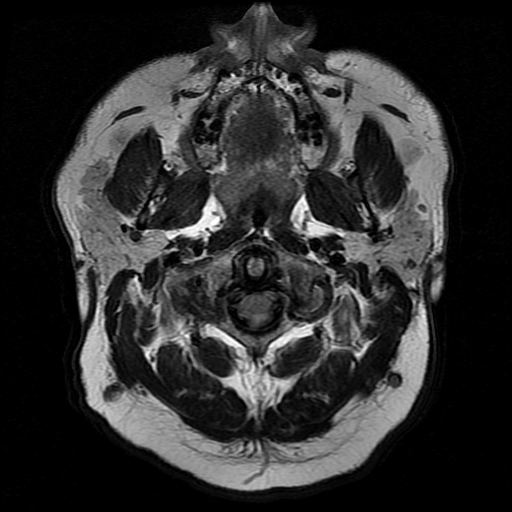
[im 14/28]
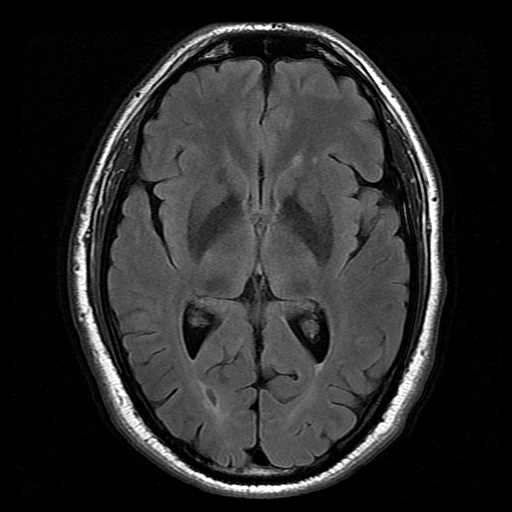
[im 28/28]
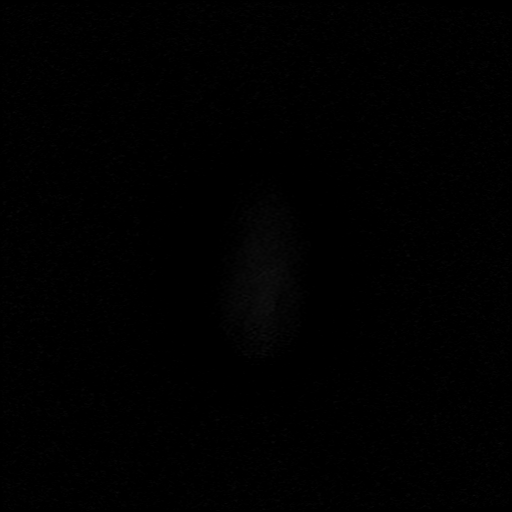

[Series 10: T2 · coronal · 5.0mm · 0.43mm/px · 3 of 31 slices shown]
[im 1/31]
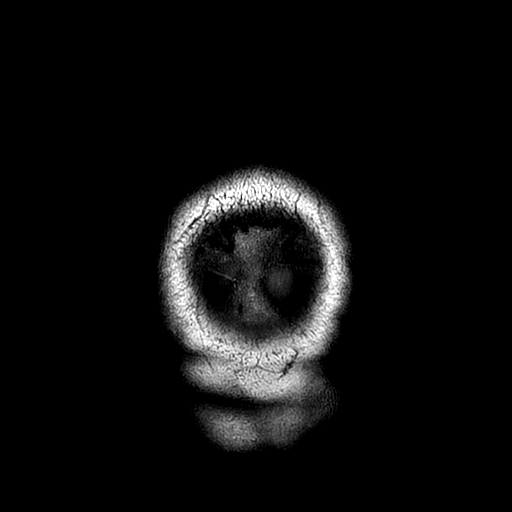
[im 16/31]
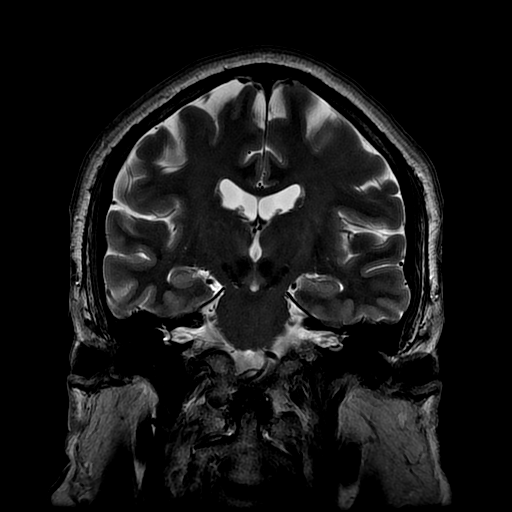
[im 31/31]
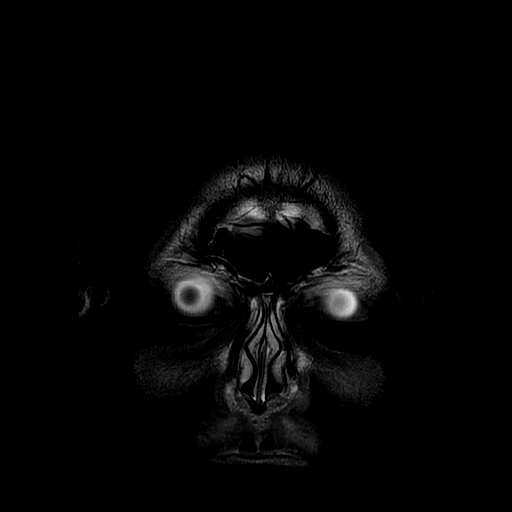

[Series 400: DWI · axial · 3.0mm · 1.09mm/px · z∈[-35,+126]mm · 6 of 55 slices shown (3 of 4)]
[im 1/55]
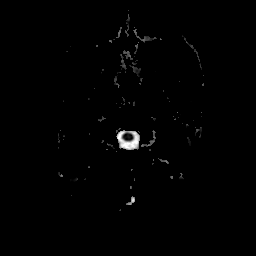
[im 11/55]
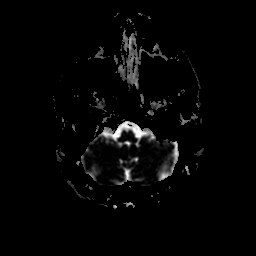
[im 22/55]
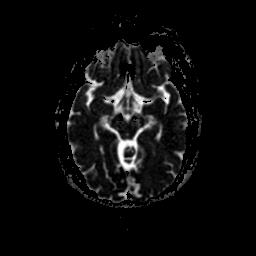
[im 33/55]
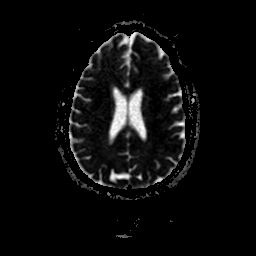
[im 44/55]
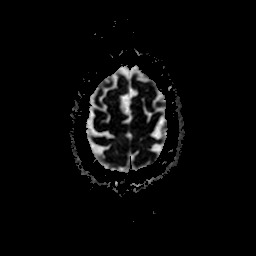
[im 55/55]
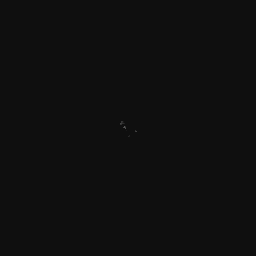

[Series 500: DWI · coronal · 5.0mm · 1.09mm/px · 4 of 36 slices shown (4 of 4)]
[im 1/36]
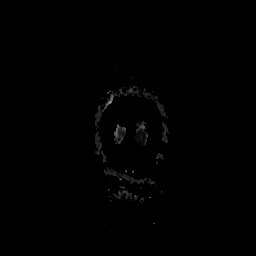
[im 12/36]
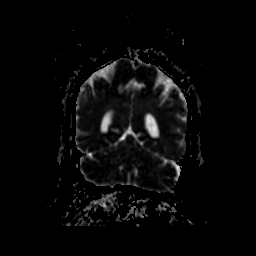
[im 24/36]
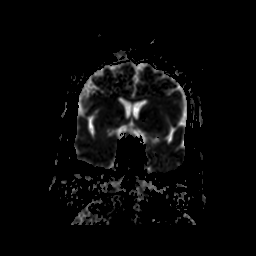
[im 36/36]
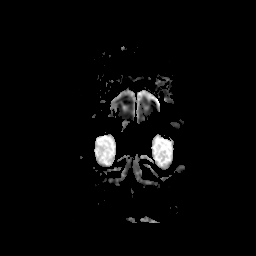

[39 of 48 positions shown; findings below may reference images not displayed]

FINDINGS: The diffusion-weighted images demonstrate no evidence for acute or
subacute infarction. A remote infarct of the post central gyrus is
again noted. No acute hemorrhage or mass lesion is present. Minimal
white matter changes within normal limits for age. Midline
structures are normal. Flow is present in the major intracranial
arteries. The globes and orbits are intact. The paranasal sinuses
and mastoid air cells are clear. The skullbase is unremarkable.
Midline structures are within normal limits.
IMPRESSION: No acute intracranial abnormality.  Next item

Remote left parietal nonhemorrhagic infarct.

## 2019-01-07 ENCOUNTER — Other Ambulatory Visit: Payer: Self-pay

## 2019-01-07 DIAGNOSIS — Z20822 Contact with and (suspected) exposure to covid-19: Secondary | ICD-10-CM

## 2019-01-09 LAB — NOVEL CORONAVIRUS, NAA: SARS-CoV-2, NAA: NOT DETECTED

## 2019-06-11 ENCOUNTER — Other Ambulatory Visit: Payer: Self-pay

## 2019-06-11 ENCOUNTER — Encounter: Payer: Self-pay | Admitting: Allergy & Immunology

## 2019-06-11 ENCOUNTER — Ambulatory Visit (INDEPENDENT_AMBULATORY_CARE_PROVIDER_SITE_OTHER): Payer: 59 | Admitting: Allergy & Immunology

## 2019-06-11 VITALS — BP 170/82 | HR 68 | Temp 97.6°F | Resp 18 | Ht 66.6 in | Wt 215.0 lb

## 2019-06-11 DIAGNOSIS — R21 Rash and other nonspecific skin eruption: Secondary | ICD-10-CM | POA: Diagnosis not present

## 2019-06-11 DIAGNOSIS — J310 Chronic rhinitis: Secondary | ICD-10-CM | POA: Diagnosis not present

## 2019-06-11 MED ORDER — CETIRIZINE HCL 10 MG PO TABS: 10.0000 mg | ORAL_TABLET | Freq: Two times a day (BID) | ORAL | 5 refills | Status: DC

## 2019-06-11 MED ORDER — IPRATROPIUM BROMIDE 0.06 % NA SOLN: 1.0000 | Freq: Three times a day (TID) | NASAL | 5 refills | Status: DC | PRN

## 2019-06-11 MED ORDER — TRIAMCINOLONE 0.1 % CREAM:EUCERIN CREAM 1:1: 1.0000 "application " | TOPICAL_CREAM | Freq: Two times a day (BID) | CUTANEOUS | 1 refills | Status: DC

## 2019-06-11 NOTE — Progress Notes (Signed)
NEW PATIENT  Date of Service/Encounter:  06/11/19  Referring provider: Herb Grays, MD (Inactive)   Assessment:   Rash - ? contact dermatitis  Chronic rhinitis    [name] presents for an evaluation of a rash of approximately 3 months duration.  It seems to have been triggered by his first Shingrix vaccine (he is due for his second). There are case reports of a rash following the Shingrix vaccine, but it typically only last for 2 to 3 days.  It does clear completely with prednisone. Unfortunately, testing today is unremarkable.  We are going to treat with cetirizine twice daily as well as triamcinolone twice daily.  We are also going to obtain patch testing.  He completed prednisone less than 1 week ago, so we are going to push the patch testing out for 2 weeks.  We are also going to get an alpha gal panel as well as a coffee IgE level.  For his rhinorrhea, we are starting ipratropium 3 times daily as needed.   Plan/Recommendations:   1. Rash - I think your rash is more consistent with a contact dermatitis. - Patch testing to chemicals will help to narrow this down. - In the meantime, start compounded triamcinolone with Eucerin twice daily to the affected areas. - Make an appointment for patch testing on your way out today. - Add on cetirizine 10mg  twice daily to help with the itching.  - We are getting an alpha gal panel and a coffee IgE level.   2. Chronic rhinitis - Testing was negative to the entire panel. - This points away from an allergic trigger. - Start nasal ipratropium 1 spray per nostril up to 3 times daily to help with runny nose.  3. Return in about 2 weeks (around 06/25/2019) for [facility] TESTING. This can be an in-person, a virtual Webex or a telephone follow up visit.   Subjective:   Bob Cook is a 59 y.o. male presenting today for evaluation of  Chief Complaint  Patient presents with  . Rash    Rash to chest and back of head that started last year.  Prednisone helps but the rash returns when he stops prednisone.   . Allergies    Constant runny nose.    Bob Cook has a history of the following: Patient Active Problem List   Diagnosis Date Noted  . Chronic rhinitis 06/11/2019  . Rash 06/11/2019    History obtained from: chart review and patient.  06/13/2019 was referred by Alice Rieger, MD (Inactive).     Bob Cook is a 59 y.o. male presenting for an evaluation of a rash.  He had a rash pop up in early February. It was within a few days of his Shingrix vaccine, although he cannot pin down the exact time course from the vaccine. It started on his neck and then spread to his chest. He had long pandemic hair at the time and thought that this was related to the hair irritating his skin, however it continued even after cutting his hair. It is very pruritic. He has been treated with prednisone with immediate improvement. However once the prednisone was weaned, the rash returned. He has tried OTC lotions without improvement. He did have a prescription steroids without improvement. He does use some kind of roll on topical Benadryl with improvement. This is isolated only on the skin; he denies throat swelling, wheezing, coughing, N/V, or other systemic symptoms. He does report some pain with the rash.  It is very painful around where the rash is located.   He had started a prostate medication shortly before the rash started, but this stopped that and there was no improvement in the rash.  He also stopped a multivitamin that he was taking without any improvement in the rash.  Otherwise, there are no new medications.  He has no changes to his laundry detergent or soaps.  He has constant rhinorrhea throughout the year for the past 3-4 years. He was exposed to black mold for a few years but moved out of it in February 2020.  He did use a nose spray but he does not remember the name of it. 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  He is not a  Electronics engineer and does not spend a ton of time outdoors.  He does eat a lot of red meat.  Otherwise, there is no history of other atopic diseases, including asthma, food allergies, drug allergies, stinging insect allergies or contact dermatitis. There is no significant infectious history. Vaccinations are up to date.    Past Medical History: Patient Active Problem List   Diagnosis Date Noted  . Chronic rhinitis 06/11/2019  . Rash 06/11/2019    Medication List:  Allergies as of 06/11/2019   No Known Allergies     Medication List       Accurate as of June 11, 2019 12:28 PM. If you have any questions, ask your nurse or doctor.        STOP taking these medications   citalopram 20 MG tablet Commonly known as: CELEXA Stopped by: Alfonse Spruce, MD   ibuprofen 800 MG tablet Commonly known as: ADVIL Stopped by: Alfonse Spruce, MD     TAKE these medications   acyclovir 400 MG tablet Commonly known as: ZOVIRAX Take 400 mg by mouth daily.   atorvastatin 80 MG tablet Commonly known as: LIPITOR Take 80 mg by mouth daily.   cetirizine 10 MG tablet Commonly known as: ZYRTEC Take 1 tablet (10 mg total) by mouth 2 (two) times daily. Started by: Alfonse Spruce, MD   clopidogrel 75 MG tablet Commonly known as: PLAVIX Take 75 mg by mouth daily with breakfast.   diphenhydrAMINE 25 MG tablet Commonly known as: BENADRYL Take 25 mg by mouth daily.   ezetimibe 10 MG tablet Commonly known as: ZETIA Take 10 mg by mouth daily.   Fish Oil 1200 MG Caps Take 1,240 mg by mouth daily.   ipratropium 0.06 % nasal spray Commonly known as: ATROVENT Place 1 spray into both nostrils 3 (three) times daily as needed for rhinitis. Started by: Alfonse Spruce, MD   lisinopril 10 MG tablet Commonly known as: ZESTRIL Take 10 mg by mouth daily.   omeprazole 20 MG capsule Commonly known as: PRILOSEC Take 20 mg by mouth daily.   triamcinolone 0.1 % cream : eucerin  Crea Apply 1 application topically 2 (two) times daily. Started by: Alfonse Spruce, MD       Birth History: non-contributory  Developmental History: non-contributory  Past Surgical History: Past Surgical History:  Procedure Laterality Date  . APPENDECTOMY    . CAROTID ENDARTERECTOMY    . HERNIA REPAIR       Family History: Family History  Problem Relation Age of Onset  . Allergic rhinitis Sister   . Angioedema Neg Hx   . Asthma Neg Hx   . Atopy Neg Hx   . Immunodeficiency Neg Hx   .  Eczema Neg Hx   . Urticaria Neg Hx      Social History: Hodari lives at home with his family.  He lives in a house that is 76-year-old.  There is hardwood in the main living areas and carpeting in the bedroom.  They have electric heating and central cooling.  There is a dog inside of the home.  He does have dust mite covers on his bed, but not his pillows.  There is tobacco exposure in the house as well as the car.  He is a current smoker.  He does have a HEPA filter in his home.  They do not live near an interstate or industrial area.  He is exposed to chemicals and fumes in his work.   Review of Systems  Constitutional: Negative.  Negative for chills, fever, malaise/fatigue and weight loss.  HENT: Negative.  Negative for congestion, ear discharge, ear pain, sinus pain and sore throat.   Eyes: Negative for pain, discharge and redness.  Respiratory: Negative for cough, sputum production, shortness of breath and wheezing.   Cardiovascular: Negative.  Negative for chest pain and palpitations.  Gastrointestinal: Negative for abdominal pain, constipation, diarrhea, heartburn, nausea and vomiting.  Skin: Positive for itching and rash.  Neurological: Negative for dizziness and headaches.  Endo/Heme/Allergies: Negative for environmental allergies. Does not bruise/bleed easily.       Objective:   Blood pressure (!) 170/82, pulse 68, temperature 97.6 F (36.4 C), temperature source Temporal,  resp. rate 18, height 5' 6.6" (1.692 m), weight 215 lb (97.5 kg), SpO2 97 %. Body mass index is 34.08 kg/m.   Physical Exam:   Physical Exam  Constitutional: He appears well-developed.  HENT:  Head: Normocephalic and atraumatic.  Right Ear: Tympanic membrane, external ear and ear canal normal. No drainage, swelling or tenderness. Tympanic membrane is not injected, not scarred, not erythematous, not retracted and not bulging.  Left Ear: Tympanic membrane, external ear and ear canal normal. No drainage, swelling or tenderness. Tympanic membrane is not injected, not scarred, not erythematous, not retracted and not bulging.  Nose: Mucosal edema and rhinorrhea present. No nasal deformity or septal deviation. No epistaxis. Right sinus exhibits no maxillary sinus tenderness and no frontal sinus tenderness. Left sinus exhibits no maxillary sinus tenderness and no frontal sinus tenderness.  Mouth/Throat: Uvula is midline and oropharynx is clear and moist. Mucous membranes are not pale and not dry.  Tonsils unremarkable.   Eyes: Pupils are equal, round, and reactive to light. Conjunctivae and EOM are normal. Right eye exhibits no chemosis and no discharge. Left eye exhibits no chemosis and no discharge. Right conjunctiva is not injected. Left conjunctiva is not injected.  Cardiovascular: Normal rate, regular rhythm and normal heart sounds.  Respiratory: Effort normal and breath sounds normal. No accessory muscle usage. No tachypnea. No respiratory distress. He has no wheezes. He has no rhonchi. He has no rales. He exhibits no tenderness.  GI: There is no abdominal tenderness. There is no rebound and no guarding.  Lymphadenopathy:       Head (right side): No submandibular, no tonsillar and no occipital adenopathy present.       Head (left side): No submandibular, no tonsillar and no occipital adenopathy present.    He has no cervical adenopathy.  Neurological: He is alert.  Skin: Rash noted. No  abrasion and no petechiae noted. Rash is pustular. Rash is not papular, not vesicular and not urticarial. No erythema. No pallor.  Multiple erythematous papular lesions on the  upper chest. There was some scabbing present on some of these. Excoriations present but not prominent. There were no vesicles. There were no hives.   Psychiatric: He has a normal mood and affect.     Diagnostic studies:   Allergy Studies:    Airborne Adult Perc - 06/11/19 0932    Time Antigen Placed  0932    Allergen Manufacturer  Lavella Hammock    Location  Back    Number of Test  59    Panel 1  Select    1. Control-Buffer 50% Glycerol  Negative    2. Control-Histamine 1 mg/ml  2+    3. Albumin saline  Negative    4. Concord  Negative    5. Guatemala  Negative    6. Johnson  Negative    7. Alvord Blue  Negative    8. Meadow Fescue  Negative    9. Perennial Rye  Negative    10. Sweet Vernal  Negative    11. Timothy  Negative    12. Cocklebur  Negative    13. Burweed Marshelder  Negative    14. Ragweed, short  Negative    15. Ragweed, Giant  Negative    16. Plantain,  English  Negative    17. Lamb's Quarters  Negative    18. Sheep Sorrell  Negative    19. Rough Pigweed  Negative    20. Marsh Elder, Rough  Negative    21. Mugwort, Common  Negative    22. Ash mix  Negative    23. Birch mix  Negative    24. Beech American  Negative    25. Box, Elder  Negative    26. Cedar, red  Negative    27. Cottonwood, [place]  Negative    28. Elm mix  Negative    29. Hickory mix  Negative    30. Maple mix  Negative    31. Oak, [place] mix  Negative    32. Pecan Pollen  Negative    33. Pine mix  Negative    34. Sycamore Eastern  Negative    35. [place], Black Pollen  Negative    36. Alternaria alternata  Negative    37. Cladosporium Herbarum  Negative    38. Aspergillus mix  Negative    39. Penicillium mix  Negative    40. Bipolaris sorokiniana (Helminthosporium)  Negative    41. Drechslera spicifera (Curvularia)   Negative    42. Mucor plumbeus  Negative    43. Fusarium moniliforme  Negative    44. Aureobasidium pullulans (pullulara)  Negative    45. Rhizopus oryzae  Negative    46. Botrytis cinera  Negative    47. Epicoccum nigrum  Negative    48. Phoma betae  Negative    49. Candida Albicans  Negative    50. Trichophyton mentagrophytes  Negative    51. Mite, D Farinae  5,000 AU/ml  Negative    52. Mite, D Pteronyssinus  5,000 AU/ml  Negative    53. Cat Hair 10,000 BAU/ml  Negative    54.  Dog Epithelia  Negative    55. Mixed Feathers  Negative    56. Horse Epithelia  Negative    57. Cockroach, German  Negative    58. Mouse  Negative    59. Tobacco Leaf  Negative       Allergy testing results were read and interpreted by myself, documented by clinical staff.  Rayanna Matusik, MD [facility]      

## 2019-06-11 NOTE — Patient Instructions (Addendum)
1. Rash - I think your rash is more consistent with a contact dermatitis. - Patch testing to chemicals will help to narrow this down. - In the meantime, start compounded triamcinolone with Eucerin twice daily to the affected areas. - Make an appointment for patch testing on your way out today. - Add on cetirizine 10mg  twice daily to help with the itching.  - We are getting an alpha gal panel and a coffee IgE level.   2. Chronic rhinitis - Testing was negative to the entire panel. - This points away from an allergic trigger. - Start nasal ipratropium 1 spray per nostril up to 3 times daily to help with runny nose.  3. Return in about 2 weeks (around 06/25/2019) for [facility] TESTING. This can be an in-person, a virtual Webex or a telephone follow up visit.   Please inform us of any Emergency Department visits, hospitalizations, or changes in symptoms. Call us before going to the ED for breathing or allergy symptoms since we might be able to fit you in for a sick visit. Feel free to contact us anytime with any questions, problems, or concerns.  It was a pleasure to meet you today!  Websites that have reliable patient information: 1. American Academy of Asthma, Allergy, and Immunology: www.aaaai.org 2. Food Allergy Research and Education (FARE): foodallergy.org 3. Mothers of Asthmatics: http://www.asthmacommunitynetwork.org 4. American College of Allergy, Asthma, and Immunology: www.acaai.org   COVID-19 Vaccine Information can be found at: [email or web address].[email or web address].uk For questions related to vaccine distribution or appointments, please email [email or web address] or call [phone].     "Like" [place] on Facebook and Instagram for our latest updates!       HAPPY SPRING!  Make sure you are registered to vote! If you have moved or changed any of your contact information, you will need to get this updated before voting!  In some cases, you  MAY be able to register to vote online: [email or web address]     True Test looks for the following sensitivities:     Rhinitis (Hayfever) Overview  There are two types of rhinitis: allergic and non-allergic.  Allergic Rhinitis If you have allergic rhinitis, your immune system mistakenly identifies a typically harmless substance as an intruder. This substance is called an allergen. The immune system responds to the allergen by releasing histamine and chemical mediators that typically cause symptoms in the nose, throat, eyes, ears, skin and roof of the mouth.  Seasonal allergic rhinitis (hay fever) is most often caused by pollen carried in the air during different times of the year in different parts of the country.  Allergic rhinitis can also be triggered by common indoor allergens such as the dried skin flakes, urine and saliva found on pet dander, mold, droppings from dust mites and cockroach particles. This is called perennial allergic rhinitis, as symptoms typically occur year-round.  In addition to allergen triggers, symptoms may also occur from irritants such as smoke and strong odors, or to changes in the temperature and humidity of the air. This happens because allergic rhinitis causes inflammation in the nasal lining, which increases sensitivity to inhalants.  Many people with allergic rhinitis are prone to allergic conjunctivitis (eye allergy). In addition, allergic rhinitis can make symptoms of asthma worse for people who suffer from both conditions.  Nonallergic Rhinitis At least one out of three people with rhinitis symptoms do not have allergies. Nonallergic rhinitis usually afflicts adults and causes year-round symptoms, especially runny nose and nasal congestion. This condition differs from allergic rhinitis because the  immune system is not involved.

## 2019-06-12 ENCOUNTER — Telehealth: Payer: Self-pay | Admitting: Allergy & Immunology

## 2019-06-12 MED ORDER — IPRATROPIUM BROMIDE 0.06 % NA SOLN: 1.0000 | Freq: Three times a day (TID) | NASAL | 5 refills | Status: DC | PRN

## 2019-06-12 MED ORDER — TRIAMCINOLONE ACETONIDE 0.1 % EX CREA: 1.0000 "application " | TOPICAL_CREAM | Freq: Two times a day (BID) | CUTANEOUS | 5 refills | Status: DC

## 2019-06-12 MED ORDER — CETIRIZINE HCL 10 MG PO TABS: 10.0000 mg | ORAL_TABLET | Freq: Two times a day (BID) | ORAL | 5 refills | Status: DC

## 2019-06-12 NOTE — Telephone Encounter (Signed)
Zyrtec, triamcinolone cream and ipratropium nasal spray needs to be sent to [facility] in [place].

## 2019-06-12 NOTE — Addendum Note (Signed)
Addended by: Mliss Fritz I on: 06/12/2019 09:21 AM   Modules accepted: Orders

## 2019-06-12 NOTE — Telephone Encounter (Signed)
I spoke with patient and verified pharmacy. I have sent in the prescriptions to his preferred pharmacy. He is aware and I did apologize for any inconvenience.

## 2019-06-30 ENCOUNTER — Ambulatory Visit (INDEPENDENT_AMBULATORY_CARE_PROVIDER_SITE_OTHER): Payer: 59 | Admitting: Family Medicine

## 2019-06-30 ENCOUNTER — Other Ambulatory Visit: Payer: Self-pay

## 2019-06-30 ENCOUNTER — Encounter: Payer: Self-pay | Admitting: Family Medicine

## 2019-06-30 VITALS — BP 132/70 | HR 65 | Temp 98.7°F | Resp 18

## 2019-06-30 DIAGNOSIS — L253 Unspecified contact dermatitis due to other chemical products: Secondary | ICD-10-CM

## 2019-06-30 NOTE — Patient Instructions (Signed)
Diagnostics: True Test patches placed.    Plan:   Allergic contact dermatitis - Instructions provided on care of the patches for the next 48 hours. Bob Cook was instructed to avoid showering for the next 48 hours. Bob Cook will follow up in 48 hours and 96 hours for patch readings.   Call the clinic if this treatment plan is not working well for you  Follow up in 2 days or sooner if needed.

## 2019-06-30 NOTE — Progress Notes (Signed)
Follow-up Note  RE: Bob Cook MRN: [id number] DOB: November 28, 1960 Date of Office Visit: 06/30/2019  Primary care provider: Herb Grays, MD (Inactive) Referring provider: No ref. provider found   Bob Cook returns to the office today for the patch test placement, given suspected history of contact dermatitis.    Diagnostics: True Test patches placed.    Plan:   Allergic contact dermatitis - Instructions provided on care of the patches for the next 48 hours. Bob Cook was instructed to avoid showering for the next 48 hours. Bob Cook will follow up in 48 hours and 96 hours for patch readings.   Follow up in 2 days or sooner if needed.  Thank you for the opportunity to care for this patient.  Please do not hesitate to contact me with questions.  Thermon Leyland, FNP [facility]

## 2019-07-02 ENCOUNTER — Encounter: Payer: Self-pay | Admitting: Allergy & Immunology

## 2019-07-02 ENCOUNTER — Ambulatory Visit: Payer: 59 | Admitting: Allergy & Immunology

## 2019-07-02 ENCOUNTER — Other Ambulatory Visit: Payer: Self-pay

## 2019-07-02 DIAGNOSIS — L253 Unspecified contact dermatitis due to other chemical products: Secondary | ICD-10-CM

## 2019-07-02 NOTE — Progress Notes (Signed)
    Follow-up Note  RE: Bob Cook MRN: [id number] DOB: [birth year] Date of Office Visit: 07/02/2019  Primary care provider: Herb Grays, MD (Inactive) Referring provider: No ref. provider found   Advit returns to the office today for the initial patch test interpretation, given suspected history of contact dermatitis.  His first visit with me was in April 2021.  At that time, he presented for an evaluation of a rash which he felt was secondary to receiving a Shingrix vaccine.  He had environmental allergy testing that was completely negative.  Therefore, we recommended that he come in for patch placement.  He came in 2 days ago to get patches placed and presents today for his first reading visit.  Diagnostics:   TRUE TEST 48-hour reading: negative to the entire panel  Plan:   Allergic contact dermatitis - We will see if anything reacts on Friday. - Otherwise we will work on a Dermatology referral.    Malachi Bonds, MD  [facility]

## 2019-07-04 ENCOUNTER — Encounter: Payer: Self-pay | Admitting: Allergy & Immunology

## 2019-07-04 ENCOUNTER — Other Ambulatory Visit: Payer: Self-pay

## 2019-07-04 ENCOUNTER — Ambulatory Visit (INDEPENDENT_AMBULATORY_CARE_PROVIDER_SITE_OTHER): Payer: 59 | Admitting: Allergy & Immunology

## 2019-07-04 DIAGNOSIS — J310 Chronic rhinitis: Secondary | ICD-10-CM

## 2019-07-04 DIAGNOSIS — R21 Rash and other nonspecific skin eruption: Secondary | ICD-10-CM | POA: Diagnosis not present

## 2019-07-04 NOTE — Progress Notes (Addendum)
    Follow-up Note  RE: Bob Cook MRN: [id number] DOB: 1960-12-17 Date of Office Visit: 07/04/2019  Primary care provider: Herb Grays, MD (Inactive) Referring provider: No ref. provider found   [name] returns to the office today for the final patch test interpretation, given suspected history of contact dermatitis.     Diagnostics:   TRUE TEST 96-hour reading: negative to the entire panel  Plan:    Allergic contact dermatitis - We offered to refer to Dermatology, but he refused that.  - He apparently [facility]. - We offered to send our records there to keep them in the loop, but he refused this as well. - It should be noted that he never got his lab testing performed (alpha gal and coffee).  - We are going to call him to remind him to get this testing performed, as alpha gal can certainly manifest as a rash.   Non-allergic rhinitis - We did offer to send in the nasal spray (Atrovent), but he refused to take anything since "it is not allergies".  - We can certainly address this with intradermal testing in the future, which is more sensitive, but unfortunately has more false positives.   Follow up as needed.   Total of 10 minutes, greater than 50% of which was spent in discussion of treatment and management options.       Malachi Bonds, MD  [facility]

## 2019-07-04 NOTE — Patient Instructions (Addendum)
1. Rash - Unfortunately testing was completely negative.  - We are going to refer you to see Dermatology.  - Be sure to bring a lot of pictures to help with this.   2. Chronic non-allergic rhinitis  - Continue with nasal ipratropium 1 spray per nostril up to 3 times daily to help with runny nose.  3. Return if symptoms worsen or fail to improve. This can be an in-person, a virtual Webex or a telephone follow up visit.   Please inform us of any Emergency Department visits, hospitalizations, or changes in symptoms. Call us before going to the ED for breathing or allergy symptoms since we might be able to fit you in for a sick visit. Feel free to contact us anytime with any questions, problems, or concerns.  It was a pleasure to see you again today!  Websites that have reliable patient information: 1. American Academy of Asthma, Allergy, and Immunology: www.aaaai.org 2. Food Allergy Research and Education (FARE): foodallergy.org 3. Mothers of Asthmatics: http://www.asthmacommunitynetwork.org 4. American College of Allergy, Asthma, and Immunology: www.acaai.org   COVID-19 Vaccine Information can be found at: PodExchange.[email or web address] For questions related to vaccine distribution or appointments, please email [email or web address] or call [phone].     "Like" [place] on Facebook and Instagram for our latest updates!       HAPPY SPRING!  Make sure you are registered to vote! If you have moved or changed any of your contact information, you will need to get this updated before voting!  In some cases, you MAY be able to register to vote online: [email or web address]

## 2023-12-07 ENCOUNTER — Other Ambulatory Visit: Payer: Self-pay

## 2023-12-07 DIAGNOSIS — M75122 Complete rotator cuff tear or rupture of left shoulder, not specified as traumatic: Secondary | ICD-10-CM

## 2024-01-07 ENCOUNTER — Ambulatory Visit
Admission: RE | Admit: 2024-01-07 | Discharge: 2024-01-07 | Disposition: A | Discharge status: Home / Self Care | Source: Ambulatory Visit

## 2024-01-07 DIAGNOSIS — M75122 Complete rotator cuff tear or rupture of left shoulder, not specified as traumatic: Secondary | ICD-10-CM

## 2024-01-21 NOTE — Progress Notes (Addendum)
 Anesthesia Review:  PCP: Cardiologist : Bob Cook preop exam on 12/31/23 and 01/08/24   PPM/ ICD: Device Orders: Rep Notified:  Chest x-ray : EKG : 03/02/23- [facility] -  Dr Cook  Echo : 12/17/23  Stress test: 01/08/24  Cardiac Cath :  01/22/2024   Activity level:  Sleep Study/ CPAP : Fasting Blood Sugar :      / Checks Blood Sugar -- times a day:    Blood Thinner/ Instructions /Last Dose: ASA / Instructions/ Last Dose :    81 mg aspirin

## 2024-01-22 NOTE — Patient Instructions (Signed)
 SURGICAL WAITING ROOM VISITATION  Patients having surgery or a procedure may have no more than 2 support people in the waiting area - these visitors may rotate.    Children ages 79 and under will not be able to visit patients in [facility] under most circumstances.   Visitors with respiratory illnesses are discouraged from visiting and should remain at home.  If the patient needs to stay at the hospital during part of their recovery, the visitor guidelines for inpatient rooms apply. Pre-op nurse will coordinate an appropriate time for 1 support person to accompany patient in pre-op.  This support person may not rotate.    Please refer to the [facility] website for the visitor guidelines for Inpatients (after your surgery is over and you are in a regular room).       Your procedure is scheduled on: 02/01/2024    Report to [facility] Main Entrance    Report to admitting at   0945 AM   Call this number if you have problems the morning of surgery [phone]   Do not eat food :After Midnight.   After Midnight you may have the following liquids until ___ 0915___ AM DAY OF SURGERY  Water Non-Citrus Juices (without pulp, NO RED-Apple, White grape, White cranberry) Black Coffee (NO MILK/CREAM OR CREAMERS, sugar ok)  Clear Tea (NO MILK/CREAM OR CREAMERS, sugar ok) regular and decaf                             Plain Jell-O (NO RED)                                           Fruit ices (not with fruit pulp, NO RED)                                     Popsicles (NO RED)                                                               Sports drinks like Gatorade (NO RED)                    The day of surgery:  Drink ONE (1) Pre-Surgery Clear Ensure or G2 at  0915 AM the morning of surgery. Drink in one sitting. Do not sip.  This drink was given to you during your hospital  pre-op appointment visit. Nothing else to drink after completing the  Pre-Surgery Clear Ensure  or G2.          If you have questions, please contact your surgeon's office.       Oral Hygiene is also important to reduce your risk of infection.                                    Remember - BRUSH YOUR TEETH THE MORNING OF SURGERY WITH YOUR REGULAR TOOTHPASTE  DENTURES WILL BE REMOVED PRIOR TO SURGERY PLEASE DO NOT APPLY  Poly grip OR ADHESIVES!!!   Do NOT smoke after Midnight   Stop all vitamins and herbal supplements 7 days before surgery.   Take these medicines the morning of surgery with A SIP OF WATER:   omeprazole, flomax   DO NOT TAKE ANY ORAL DIABETIC MEDICATIONS DAY OF YOUR SURGERY  Bring CPAP mask and tubing day of surgery.                              You may not have any metal on your body including hair pins, jewelry, and body piercing             Do not wear make-up, lotions, powders, perfumes/cologne, or deodorant  Do not wear nail polish including gel and S&S, artificial/acrylic nails, or any other type of covering on natural nails including finger and toenails. If you have artificial nails, gel coating, etc. that needs to be removed by a nail salon please have this removed prior to surgery or surgery may need to be canceled/ delayed if the surgeon/ anesthesia feels like they are unable to be safely monitored.   Do not shave  48 hours prior to surgery.               Men may shave face and neck.   Do not bring valuables to the hospital. [place] IS NOT             RESPONSIBLE   FOR VALUABLES.   Contacts, glasses, dentures or bridgework may not be worn into surgery.   Bring small overnight bag day of surgery.   DO NOT BRING YOUR HOME MEDICATIONS TO THE HOSPITAL. PHARMACY WILL DISPENSE MEDICATIONS LISTED ON YOUR MEDICATION LIST TO YOU DURING YOUR ADMISSION IN THE HOSPITAL!    Patients discharged on the day of surgery will not be allowed to drive home.  Someone NEEDS to stay with you for the first 24 hours after anesthesia.   Special Instructions: Bring  a copy of your healthcare power of attorney and living will documents the day of surgery if you haven't scanned them before.              Please read over the following fact sheets you were given: IF YOU HAVE QUESTIONS ABOUT YOUR PRE-OP INSTRUCTIONS PLEASE CALL [phone].   If you received a COVID test during your pre-op visit  it is requested that you wear a mask when out in public, stay away from anyone that may not be feeling well and notify your surgeon if you develop symptoms. If you test positive for Covid or have been in contact with anyone that has tested positive in the last 10 days please notify you surgeon.      Pre-operative 4 CHG Bath Instructions   You can play a key role in reducing the risk of infection after surgery. Your skin needs to be as free of germs as possible. You can reduce the number of germs on your skin by washing with CHG (chlorhexidine gluconate) soap before surgery. CHG is an antiseptic soap that kills germs and continues to kill germs even after washing.   DO NOT use if you have an allergy  to chlorhexidine/CHG or antibacterial soaps. If your skin becomes reddened or irritated, stop using the CHG and notify one of our RNs at [phone].   Please shower with the CHG soap starting 4 days before surgery using the following schedule:     Please  keep in mind the following:  DO NOT shave, including legs and underarms, starting the day of your first shower.   You may shave your face at any point before/day of surgery.  Place clean sheets on your bed the day you start using CHG soap. Use a clean washcloth (not used since being washed) for each shower. DO NOT sleep with pets once you start using the CHG.   CHG Shower Instructions:  If you choose to wash your hair and private area, wash first with your normal shampoo/soap.  After you use shampoo/soap, rinse your hair and body thoroughly to remove shampoo/soap residue.  Turn the water OFF and apply about 3  tablespoons (45 ml) of CHG soap to a CLEAN washcloth.  Apply CHG soap ONLY FROM YOUR NECK DOWN TO YOUR TOES (washing for 3-5 minutes)  DO NOT use CHG soap on face, private areas, open wounds, or sores.  Pay special attention to the area where your surgery is being performed.  If you are having back surgery, having someone wash your back for you may be helpful. Wait 2 minutes after CHG soap is applied, then you may rinse off the CHG soap.  Pat dry with a clean towel  Put on clean clothes/pajamas   If you choose to wear lotion, please use ONLY the CHG-compatible lotions on the back of this paper.     Additional instructions for the day of surgery: DO NOT APPLY any lotions, deodorants, cologne, or perfumes.   Put on clean/comfortable clothes.  Brush your teeth.  Ask your nurse before applying any prescription medications to the skin.      CHG Compatible Lotions   Aveeno Moisturizing lotion  Cetaphil Moisturizing Cream  Cetaphil Moisturizing Lotion  Clairol Herbal Essence Moisturizing Lotion, Dry Skin  Clairol Herbal Essence Moisturizing Lotion, Extra Dry Skin  Clairol Herbal Essence Moisturizing Lotion, Normal Skin  Curel Age Defying Therapeutic Moisturizing Lotion with Alpha Hydroxy  Curel Extreme Care Body Lotion  Curel Soothing Hands Moisturizing Hand Lotion  Curel Therapeutic Moisturizing Cream, Fragrance-Free  Curel Therapeutic Moisturizing Lotion, Fragrance-Free  Curel Therapeutic Moisturizing Lotion, Original Formula  Eucerin Daily Replenishing Lotion  Eucerin Dry Skin Therapy Plus Alpha Hydroxy Crme  Eucerin Dry Skin Therapy Plus Alpha Hydroxy Lotion  Eucerin Original Crme  Eucerin Original Lotion  Eucerin Plus Crme Eucerin Plus Lotion  Eucerin TriLipid Replenishing Lotion  Keri Anti-Bacterial Hand Lotion  Keri Deep Conditioning Original Lotion Dry Skin Formula Softly Scented  Keri Deep Conditioning Original Lotion, Fragrance Free Sensitive Skin Formula  Keri  Lotion Fast Absorbing Fragrance Free Sensitive Skin Formula  Keri Lotion Fast Absorbing Softly Scented Dry Skin Formula  Keri Original Lotion  Keri Skin Renewal Lotion Keri Silky Smooth Lotion  Keri Silky Smooth Sensitive Skin Lotion  Nivea Body Creamy Conditioning Oil  Nivea Body Extra Enriched Lotion  Nivea Body Original Lotion  Nivea Body Sheer Moisturizing Lotion Nivea Crme  Nivea Skin Firming Lotion  NutraDerm 30 Skin Lotion  NutraDerm Skin Lotion  NutraDerm Therapeutic Skin Cream  NutraDerm Therapeutic Skin Lotion  ProShield Protective Hand Cream  Provon moisturizing lotion  [place]- Preparing for Total Shoulder Arthroplasty    Before surgery, you can play an important role. Because skin is not sterile, your skin needs to be as free of germs as possible. You can reduce the number of germs on your skin by using the following products. Benzoyl Peroxide Gel Reduces the number of germs present on the skin Applied twice a day  to shoulder area starting two days before surgery    ==================================================================  Please follow these instructions carefully:  BENZOYL PEROXIDE 5% GEL  Please do not use if you have an allergy  to benzoyl peroxide.   If your skin becomes reddened/irritated stop using the benzoyl peroxide.  Starting two days before surgery, apply as follows: Apply benzoyl peroxide in the morning and at night. Apply after taking a shower. If you are not taking a shower clean entire shoulder front, back, and side along with the armpit with a clean wet washcloth.  Place a quarter-sized dollop on your shoulder and rub in thoroughly, making sure to cover the front, back, and side of your shoulder, along with the armpit.   2 days before ____ AM   ____ PM              1 day before ____ AM   ____ PM                         Do this twice a day for two days.  (Last application is the night before surgery, AFTER using the CHG soap as  described below).  Do NOT apply benzoyl peroxide gel on the day of surgery.

## 2024-01-23 ENCOUNTER — Encounter (HOSPITAL_COMMUNITY): Admission: RE | Admit: 2024-01-23 | Discharge: 2024-01-23 | Disposition: A | Source: Ambulatory Visit

## 2024-02-01 ENCOUNTER — Ambulatory Visit: Admit: 2024-02-01

## 2024-02-01 SURGERY — ARTHROPLASTY, SHOULDER, TOTAL, REVERSE
Anesthesia: General | Site: Shoulder | Laterality: Left
